# Patient Record
Sex: Female | Born: 1990 | Race: Black or African American | Hispanic: No | Marital: Single | Smoking: Former smoker
Health system: Southern US, Community
[De-identification: ages and names within clinical notes are randomized; demographics above are authoritative.]

## PROBLEM LIST (undated history)

## (undated) ENCOUNTER — Emergency Department (HOSPITAL_COMMUNITY): Admission: EM | Discharge status: Absent without leave | Payer: Self-pay

## (undated) DIAGNOSIS — R7303 Prediabetes: Secondary | ICD-10-CM

## (undated) DIAGNOSIS — I10 Essential (primary) hypertension: Secondary | ICD-10-CM

## (undated) HISTORY — PX: FRACTURE SURGERY: SHX138

---
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
## 2008-06-05 ENCOUNTER — Emergency Department (HOSPITAL_COMMUNITY): Admission: EM | Admit: 2008-06-05 | Discharge: 2008-06-05 | Payer: Self-pay | Admitting: Family Medicine

## 2008-06-05 ENCOUNTER — Inpatient Hospital Stay (HOSPITAL_COMMUNITY): Admission: AD | Admit: 2008-06-05 | Discharge: 2008-06-06 | Payer: Self-pay | Admitting: Obstetrics & Gynecology

## 2008-08-10 ENCOUNTER — Ambulatory Visit (HOSPITAL_COMMUNITY): Admission: RE | Admit: 2008-08-10 | Discharge: 2008-08-10 | Payer: Self-pay | Admitting: Obstetrics & Gynecology

## 2008-10-26 ENCOUNTER — Inpatient Hospital Stay (HOSPITAL_COMMUNITY): Admission: AD | Admit: 2008-10-26 | Discharge: 2008-10-31 | Payer: Self-pay | Admitting: Obstetrics & Gynecology

## 2008-10-27 ENCOUNTER — Encounter: Payer: Self-pay | Admitting: Obstetrics

## 2010-07-30 LAB — CBC
HCT: 29.1 % — ABNORMAL LOW (ref 36.0–46.0)
HCT: 30.5 % — ABNORMAL LOW (ref 36.0–46.0)
HCT: 30.8 % — ABNORMAL LOW (ref 36.0–46.0)
Hemoglobin: 10.1 g/dL — ABNORMAL LOW (ref 12.0–15.0)
Hemoglobin: 10.3 g/dL — ABNORMAL LOW (ref 12.0–15.0)
MCHC: 32.9 g/dL (ref 30.0–36.0)
MCV: 78.3 fL (ref 78.0–100.0)
MCV: 79.1 fL (ref 78.0–100.0)
Platelets: 297 10*3/uL (ref 150–400)
Platelets: 303 10*3/uL (ref 150–400)
RBC: 3.72 MIL/uL — ABNORMAL LOW (ref 3.87–5.11)
RBC: 3.87 MIL/uL (ref 3.87–5.11)
WBC: 16.6 10*3/uL — ABNORMAL HIGH (ref 4.0–10.5)
WBC: 24.3 10*3/uL — ABNORMAL HIGH (ref 4.0–10.5)

## 2010-07-30 LAB — COMPREHENSIVE METABOLIC PANEL
Albumin: 2.8 g/dL — ABNORMAL LOW (ref 3.5–5.2)
Alkaline Phosphatase: 85 U/L (ref 39–117)
BUN: 3 mg/dL — ABNORMAL LOW (ref 6–23)
CO2: 22 mEq/L (ref 19–32)
Chloride: 105 mEq/L (ref 96–112)
Creatinine, Ser: 0.44 mg/dL (ref 0.4–1.2)
GFR calc non Af Amer: >60 mL/min (ref >60)
Glucose, Bld: 93 mg/dL (ref 70–99)
Potassium: 3.5 mEq/L (ref 3.5–5.1)
Total Bilirubin: 0.4 mg/dL (ref 0.3–1.2)

## 2010-07-30 LAB — DIFFERENTIAL
Basophils Absolute: 0 10*3/uL (ref 0.0–0.1)
Basophils Relative: 0 % (ref 0–1)
Lymphocytes Relative: 10 % — ABNORMAL LOW (ref 12–46)
Neutro Abs: 13.8 10*3/uL — ABNORMAL HIGH (ref 1.7–7.7)
Neutrophils Relative %: 83 % — ABNORMAL HIGH (ref 43–77)

## 2010-07-30 LAB — URINE CULTURE
Colony Count: NO GROWTH
Culture: NO GROWTH
Special Requests: NEGATIVE

## 2010-07-30 LAB — URINALYSIS, MICROSCOPIC ONLY
Bilirubin Urine: NEGATIVE
Glucose, UA: NEGATIVE mg/dL
Hgb urine dipstick: NEGATIVE
Protein, ur: NEGATIVE mg/dL
Urobilinogen, UA: 0.2 mg/dL (ref 0.0–1.0)

## 2010-07-30 LAB — RPR: RPR Ser Ql: NONREACTIVE

## 2010-07-30 LAB — STREP B DNA PROBE

## 2010-08-08 LAB — ABO/RH: ABO/RH(D): O POS

## 2010-08-08 LAB — CBC
HCT: 36.9 % (ref 36.0–46.0)
Hemoglobin: 12.1 g/dL (ref 12.0–15.0)
MCHC: 32.7 g/dL (ref 30.0–36.0)
MCV: 81.4 fL (ref 78.0–100.0)
Platelets: 324 10*3/uL (ref 150–400)
RBC: 4.53 MIL/uL (ref 3.87–5.11)
RDW: 14.2 % (ref 11.5–15.5)
WBC: 16.3 10*3/uL — ABNORMAL HIGH (ref 4.0–10.5)

## 2010-08-08 LAB — HCG, QUANTITATIVE, PREGNANCY: hCG, Beta Chain, Quant, S: 74132 m[IU]/mL — ABNORMAL HIGH (ref <5)

## 2010-08-08 LAB — POCT URINALYSIS DIP (DEVICE)
Bilirubin Urine: NEGATIVE
Glucose, UA: NEGATIVE mg/dL
Specific Gravity, Urine: 1.02 (ref 1.005–1.030)
pH: 7 (ref 5.0–8.0)

## 2010-09-08 NOTE — Discharge Summary (Signed)
[name], [name]               ACCOUNT NO.:  [id number]   MEDICAL RECORD NO.:  [id number]          PATIENT TYPE:  INP   LOCATION:  9320                          FACILITY:  [facility]   PHYSICIAN:  Charles A. Clearance Coots, M.D.DATE OF BIRTH:  09-09-1990   DATE OF ADMISSION:  10/26/2008  DATE OF DISCHARGE:  10/31/2008                               DISCHARGE SUMMARY   ADMITTING DIAGNOSES:  30 weeks' gestation, premature rupture of  membranes.   DISCHARGE DIAGNOSES:  30 weeks' gestation, premature rupture of  membranes, status post normal spontaneous vaginal delivery, viable  female on October 29, 2008, at 09:05.  There were no intrapartum  complications and the infant was taken to the Neonatal Intensive Care  Unit for prematurity.  Mother was discharged home in good condition.   REASON FOR ADMISSION:  A 20 year old G1 with estimated date of  confinement of January 04, 2009, presented with a complaint of leaking  clear fluid.  She denied uterine contractions.   PAST MEDICAL HISTORY:   SURGERY:  Right arm.   ILLNESSES:  Seasonal allergies.   MEDICATIONS:  Prenatal vitamins.   ALLERGIES:  SEASONAL.  No known drug allergies   SOCIAL HISTORY:  Consulting civil engineer.  Negative tobacco, alcohol or recreational  drug use.   FAMILY HISTORY:  Lung cancer, cerebrovascular accidents, diabetes and  hypertension.   REVIEW OF SYSTEMS:  Remarkable for genitourinary system with leaking  clear fluid.   PHYSICAL EXAMINATION:  GENERAL:  A well-nourished, well-developed female  in no acute distress.  VITAL SIGNS:  Temperature is 98.4, pulse is 82, respiratory rate 18,  blood pressure 116/61.  LUNGS:  Clear to auscultation bilaterally.  HEART:  Regular rate and rhythm.  ABDOMEN:  Gravid, nontender.  PELVIC:  Sterile speculum exam reveals gross pooling of fluid in the  posterior vaginal vault, Nitrazine positive and fern positive.  Cervix  was 3 cm dilated, 90% effaced, and vertex at a -1 station.   ADMITTING  LABORATORY FINDINGS:  Hemoglobin 10, hematocrit 30, Leggette  blood cell count of 16,600, platelets 297,000.  Comprehensive metabolic  panel was within normal limits.  RPR was nonreactive.  Urinalysis was  within normal limits.  Urine culture was sent.   HOSPITAL COURSE:  The patient was admitted, placed on bedrest, IV  antibiotics and was started on magnesium sulfate IV for cerebral palsy  prophylaxis.  She did well on bed rest.  She was given a standard  regimen of betamethasone for acceleration of fetal lung maturity.  The  patient started to have uterine contractions on hospital day #3 and on  exam it was 5 cm dilated, 100% effaced in a vertex with a 0 station.  She was transferred to labor and delivery and progressed rapidly to  normal spontaneous vaginal delivery of a viable female premature infant.  The infant was taken to the Neonatal Intensive Care Unit.  There were no  intrapartum complications.  The postpartum course was uncomplicated.  The patient was discharged home on postpartum day #2 in good condition.   DISCHARGE LABORATORY FINDINGS:  Hemoglobin of 10, hematocrit 30, Yon  blood  cell count 15,900, platelets 306,000.   DISCHARGE DISPOSITION:  Medications; ibuprofen was prescribed for pain.  Continue prenatal vitamins.  Routine written instructions were given for  discharge after vaginal delivery.  The patient is to call office for a  followup appointment in 6 weeks.      Charles A. Clearance Coots, M.D.  Electronically Signed     CAH/MEDQ  D:  11/10/2008  T:  11/11/2008  Job:  [id number]

## 2010-09-18 ENCOUNTER — Emergency Department (HOSPITAL_COMMUNITY): Payer: BC Managed Care – PPO

## 2010-09-18 ENCOUNTER — Emergency Department (HOSPITAL_COMMUNITY)
Admission: EM | Admit: 2010-09-18 | Discharge: 2010-09-18 | Discharge status: Against Medical Advice | Payer: BC Managed Care – PPO | Attending: Emergency Medicine | Admitting: Emergency Medicine

## 2010-09-18 DIAGNOSIS — M79609 Pain in unspecified limb: Secondary | ICD-10-CM | POA: Insufficient documentation

## 2011-12-18 ENCOUNTER — Encounter (HOSPITAL_COMMUNITY): Payer: Self-pay | Admitting: Emergency Medicine

## 2011-12-18 ENCOUNTER — Emergency Department (HOSPITAL_COMMUNITY)
Admission: EM | Admit: 2011-12-18 | Discharge: 2011-12-18 | Disposition: A | Discharge status: Home / Self Care | Payer: Self-pay | Attending: Emergency Medicine | Admitting: Emergency Medicine

## 2011-12-18 DIAGNOSIS — N730 Acute parametritis and pelvic cellulitis: Secondary | ICD-10-CM

## 2011-12-18 DIAGNOSIS — N739 Female pelvic inflammatory disease, unspecified: Secondary | ICD-10-CM | POA: Insufficient documentation

## 2011-12-18 DIAGNOSIS — F17200 Nicotine dependence, unspecified, uncomplicated: Secondary | ICD-10-CM | POA: Insufficient documentation

## 2011-12-18 DIAGNOSIS — A599 Trichomoniasis, unspecified: Secondary | ICD-10-CM | POA: Insufficient documentation

## 2011-12-18 LAB — WET PREP, GENITAL: Yeast Wet Prep HPF POC: NONE SEEN

## 2011-12-18 LAB — URINALYSIS, ROUTINE W REFLEX MICROSCOPIC
Bilirubin Urine: NEGATIVE
Glucose, UA: NEGATIVE mg/dL
Nitrite: POSITIVE — AB
Specific Gravity, Urine: 1.025 (ref 1.005–1.030)
pH: 5.5 (ref 5.0–8.0)

## 2011-12-18 LAB — CBC
MCH: 26.5 pg (ref 26.0–34.0)
Platelets: 333 10*3/uL (ref 150–400)
RBC: 5.1 MIL/uL (ref 3.87–5.11)

## 2011-12-18 LAB — URINE MICROSCOPIC-ADD ON

## 2011-12-18 MED ORDER — LIDOCAINE HCL 1 % IJ SOLN
INTRAMUSCULAR | Status: AC
  Administered 2011-12-18: 2 mL
  Filled 2011-12-18: qty 20

## 2011-12-18 MED ORDER — SULFAMETHOXAZOLE-TRIMETHOPRIM 800-160 MG PO TABS: 1.0000 | ORAL_TABLET | Freq: Two times a day (BID) | ORAL | Status: AC

## 2011-12-18 MED ORDER — AZITHROMYCIN 1 G PO PACK
1.0000 g | PACK | Freq: Once | ORAL | Status: AC
  Administered 2011-12-18: 1 g via ORAL
  Filled 2011-12-18: qty 1

## 2011-12-18 MED ORDER — METRONIDAZOLE 500 MG PO TABS: 500.0000 mg | ORAL_TABLET | Freq: Two times a day (BID) | ORAL | Status: AC

## 2011-12-18 MED ORDER — CEFTRIAXONE SODIUM 250 MG IJ SOLR
250.0000 mg | Freq: Once | INTRAMUSCULAR | Status: AC
  Administered 2011-12-18: 250 mg via INTRAMUSCULAR
  Filled 2011-12-18: qty 250

## 2011-12-18 NOTE — ED Provider Notes (Signed)
History     CSN: [id number]  Arrival date & time 12/18/11  1612   First MD Initiated Contact with Patient 12/18/11 2043      Chief Complaint  Patient presents with  . Abdominal Pain  . Back Pain    (Consider location/radiation/quality/duration/timing/severity/associated sxs/prior treatment) Patient is a 21 y.o. female presenting with abdominal pain and back pain. The history is provided by the patient.  Abdominal Pain The primary symptoms of the illness include abdominal pain.  Additional symptoms associated with the illness include back pain.  Back Pain  Associated symptoms include abdominal pain.   patient here with lower abdominal pain and Kreiger vaginal discharge. No dysuria or hematuria. No fever. Does admit to unprotected intercourse. Denies any vaginal rashes. No medications taken prior to arrival. Nothing makes her symptoms better or worse  History reviewed. No pertinent past medical history.  History reviewed. No pertinent past surgical history.  No family history on file.  History  Substance Use Topics  . Smoking status: Current Everyday Smoker -- 0.2 packs/day    Types: Cigarettes  . Smokeless tobacco: Never Used  . Alcohol Use: Yes     socially    OB History    Grav Para Term Preterm Abortions TAB SAB Ect Mult Living                  Review of Systems  Gastrointestinal: Positive for abdominal pain.  Musculoskeletal: Positive for back pain.  All other systems reviewed and are negative.    Allergies  Review of patient's allergies indicates no known allergies.  Home Medications  No current outpatient prescriptions on file.  BP 126/87  Pulse 73  Temp 98.1 F (36.7 C) (Oral)  Resp 20  Ht 5\' 8"  (1.727 m)  Wt 213 lb (96.616 kg)  BMI 32.39 kg/m2  SpO2 100%  LMP 11/27/2011  Physical Exam  Nursing note and vitals reviewed. Constitutional: She is oriented to person, place, and time. She appears well-developed and well-nourished.  Non-toxic  appearance. No distress.  HENT:  Head: Normocephalic and atraumatic.  Eyes: Conjunctivae, EOM and lids are normal. Pupils are equal, round, and reactive to light.  Neck: Normal range of motion. Neck supple. No tracheal deviation present. No mass present.  Cardiovascular: Normal rate, regular rhythm and normal heart sounds.  Exam reveals no gallop.   No murmur heard. Pulmonary/Chest: Effort normal and breath sounds normal. No stridor. No respiratory distress. She has no decreased breath sounds. She has no wheezes. She has no rhonchi. She has no rales.  Abdominal: Soft. Normal appearance and bowel sounds are normal. She exhibits no distension. There is no tenderness. There is no rigidity, no rebound, no guarding and no CVA tenderness.  Genitourinary: Vaginal discharge found.  Musculoskeletal: Normal range of motion. She exhibits no edema and no tenderness.  Neurological: She is alert and oriented to person, place, and time. She has normal strength. No cranial nerve deficit or sensory deficit. GCS eye subscore is 4. GCS verbal subscore is 5. GCS motor subscore is 6.  Skin: Skin is warm and dry. No abrasion and no rash noted.  Psychiatric: She has a normal mood and affect. Her speech is normal and behavior is normal.    ED Course  Procedures (including critical care time)  Labs Reviewed  URINALYSIS, ROUTINE W REFLEX MICROSCOPIC - Abnormal; Notable for the following:    APPearance CLOUDY (*)     Hgb urine dipstick TRACE (*)     Nitrite POSITIVE (*)  Leukocytes, UA LARGE (*)     All other components within normal limits  URINE MICROSCOPIC-ADD ON - Abnormal; Notable for the following:    Squamous Epithelial / LPF FEW (*)     Bacteria, UA MANY (*)     All other components within normal limits  POCT PREGNANCY, URINE  WET PREP, GENITAL  GC/CHLAMYDIA PROBE AMP, GENITAL  CBC   No results found.   No diagnosis found.    MDM  Pt to be treated for pid and trich        Toy Baker, MD 12/18/11 2240

## 2011-12-18 NOTE — ED Notes (Signed)
Pt presents with lower abdominal pain; sts she has Requena odorless vaginal discharge; denies any trouble urinating, admits to not using contraception with current sexual partner.  Pt also has lower back pain that she describes as sharp; pain does not radiate.

## 2011-12-19 LAB — GC/CHLAMYDIA PROBE AMP, GENITAL
Chlamydia, DNA Probe: NEGATIVE
GC Probe Amp, Genital: NEGATIVE

## 2013-01-08 ENCOUNTER — Emergency Department (HOSPITAL_COMMUNITY)
Admission: EM | Admit: 2013-01-08 | Discharge: 2013-01-08 | Disposition: A | Discharge status: Home / Self Care | Payer: BC Managed Care – PPO | Attending: Emergency Medicine | Admitting: Emergency Medicine

## 2013-01-08 ENCOUNTER — Encounter (HOSPITAL_COMMUNITY): Payer: Self-pay | Admitting: *Deleted

## 2013-01-08 DIAGNOSIS — H60399 Other infective otitis externa, unspecified ear: Secondary | ICD-10-CM | POA: Insufficient documentation

## 2013-01-08 DIAGNOSIS — F17200 Nicotine dependence, unspecified, uncomplicated: Secondary | ICD-10-CM | POA: Insufficient documentation

## 2013-01-08 DIAGNOSIS — H6093 Unspecified otitis externa, bilateral: Secondary | ICD-10-CM

## 2013-01-08 MED ORDER — ANTIPYRINE-BENZOCAINE 5.4-1.4 % OT SOLN: 3.0000 [drp] | OTIC | Status: DC | PRN

## 2013-01-08 MED ORDER — CIPROFLOXACIN-DEXAMETHASONE 0.3-0.1 % OT SUSP: 4.0000 [drp] | Freq: Two times a day (BID) | OTIC | Status: DC

## 2013-01-08 NOTE — ED Notes (Signed)
Started with right ear pain 4-5 days ago. Left ear started hurting 2 days ago. States has "bad allergies and sinus problems".

## 2013-01-08 NOTE — ED Provider Notes (Signed)
CSN: [id number]     Arrival date & time 01/08/13  1124 History   First MD Initiated Contact with Patient 01/08/13 1327     Chief Complaint  Patient presents with  . Otalgia   (Consider location/radiation/quality/duration/timing/severity/associated sxs/prior Treatment) The history is provided by the patient and medical records.   Presents to the ED for bilateral otalgia x5 days. Patient states she has had some serous drainage from her ears in her hearing sounds "muffled". No trauma to the ears.  Denies any bleeding from the ears. She denies any swimming activities or head submersion. No recent fevers, sweats, or chills.  History reviewed. No pertinent past medical history. History reviewed. No pertinent past surgical history. History reviewed. No pertinent family history. History  Substance Use Topics  . Smoking status: Current Every Day Smoker -- 0.25 packs/day    Types: Cigarettes  . Smokeless tobacco: Never Used  . Alcohol Use: Yes     Comment: socially   OB History   Grav Para Term Preterm Abortions TAB SAB Ect Mult Living                 Review of Systems  HENT: Positive for ear pain and ear discharge.   All other systems reviewed and are negative.    Allergies  Review of patient's allergies indicates no known allergies.  Home Medications  No current outpatient prescriptions on file. BP 124/68  Pulse 85  Temp(Src) 98.2 F (36.8 C) (Oral)  Resp 18  SpO2 98%  LMP 12/27/2012  Physical Exam  Nursing note and vitals reviewed. Constitutional: She is oriented to person, place, and time. She appears well-developed and well-nourished. No distress.  HENT:  Head: Normocephalic and atraumatic.  Right Ear: Tympanic membrane normal. There is drainage.  Left Ear: Tympanic membrane normal. There is drainage.  EAC's non-erythematous but swollen bilaterally with serous drainage present; pain with movement of external ear bilaterally; TM's normal in appearance  Eyes:  Conjunctivae and EOM are normal. Pupils are equal, round, and reactive to light.  Neck: Normal range of motion. Neck supple.  Cardiovascular: Normal rate, regular rhythm and normal heart sounds.   Pulmonary/Chest: Effort normal and breath sounds normal.  Musculoskeletal: Normal range of motion.  Lymphadenopathy:    She has no cervical adenopathy.  Neurological: She is alert and oriented to person, place, and time.  Skin: Skin is warm. She is not diaphoretic.  Psychiatric: She has a normal mood and affect.    ED Course  Procedures (including critical care time) Labs Review Labs Reviewed - No data to display Imaging Review No results found.  MDM   1. Otitis externa of both ears    Bilateral otitis externa without signs of OM. Patient will be started on Ciprodex and Auralgon.  She will FU with [facility] if no improvement in the next few days.  Discussed plan with pt and mom, they agreed.  Return precautions advised.  Garlon Hatchet, PA-C 01/08/13 1402

## 2013-01-08 NOTE — ED Provider Notes (Signed)
Medical screening examination/treatment/procedure(s) were performed by non-physician practitioner and as supervising physician I was immediately available for consultation/collaboration.  Addalyne Vandehei F Arzella Rehmann, MD 01/08/13 1832 

## 2013-01-08 NOTE — ED Notes (Signed)
Reports bilateral ear pain since Saturday, no acute distress noted at triage.

## 2013-11-07 ENCOUNTER — Emergency Department (HOSPITAL_COMMUNITY)
Admission: EM | Admit: 2013-11-07 | Discharge: 2013-11-08 | Disposition: A | Discharge status: Home / Self Care | Payer: BC Managed Care – PPO | Attending: Emergency Medicine | Admitting: Emergency Medicine

## 2013-11-07 ENCOUNTER — Encounter (HOSPITAL_COMMUNITY): Payer: Self-pay | Admitting: Emergency Medicine

## 2013-11-07 DIAGNOSIS — L02419 Cutaneous abscess of limb, unspecified: Secondary | ICD-10-CM | POA: Insufficient documentation

## 2013-11-07 DIAGNOSIS — L03115 Cellulitis of right lower limb: Secondary | ICD-10-CM

## 2013-11-07 DIAGNOSIS — F17200 Nicotine dependence, unspecified, uncomplicated: Secondary | ICD-10-CM | POA: Insufficient documentation

## 2013-11-07 DIAGNOSIS — L03119 Cellulitis of unspecified part of limb: Principal | ICD-10-CM

## 2013-11-07 LAB — I-STAT CHEM 8, ED
BUN: 6 mg/dL (ref 6–23)
CALCIUM ION: 1.19 mmol/L (ref 1.12–1.23)
CREATININE: 0.7 mg/dL (ref 0.50–1.10)
Chloride: 100 mEq/L (ref 96–112)
GLUCOSE: 98 mg/dL (ref 70–99)
HEMATOCRIT: 41 % (ref 36.0–46.0)
HEMOGLOBIN: 13.9 g/dL (ref 12.0–15.0)
POTASSIUM: 3.4 meq/L — AB (ref 3.7–5.3)
Sodium: 139 mEq/L (ref 137–147)
TCO2: 24 mmol/L (ref 0–100)

## 2013-11-07 LAB — CBC WITH DIFFERENTIAL/PLATELET
BASOS PCT: 0 % (ref 0–1)
Basophils Absolute: 0 10*3/uL (ref 0.0–0.1)
EOS ABS: 0.3 10*3/uL (ref 0.0–0.7)
EOS PCT: 2 % (ref 0–5)
HCT: 37.7 % (ref 36.0–46.0)
HEMOGLOBIN: 11.9 g/dL — AB (ref 12.0–15.0)
LYMPHS ABS: 3.1 10*3/uL (ref 0.7–4.0)
Lymphocytes Relative: 20 % (ref 12–46)
MCH: 23.8 pg — AB (ref 26.0–34.0)
MCHC: 31.6 g/dL (ref 30.0–36.0)
MCV: 75.6 fL — AB (ref 78.0–100.0)
MONOS PCT: 5 % (ref 3–12)
Monocytes Absolute: 0.8 10*3/uL (ref 0.1–1.0)
NEUTROS PCT: 73 % (ref 43–77)
Neutro Abs: 11.2 10*3/uL — ABNORMAL HIGH (ref 1.7–7.7)
Platelets: 404 10*3/uL — ABNORMAL HIGH (ref 150–400)
RBC: 4.99 MIL/uL (ref 3.87–5.11)
RDW: 15.6 % — ABNORMAL HIGH (ref 11.5–15.5)
WBC: 15.4 10*3/uL — ABNORMAL HIGH (ref 4.0–10.5)

## 2013-11-07 MED ORDER — CLINDAMYCIN HCL 150 MG PO CAPS: 150.0000 mg | ORAL_CAPSULE | Freq: Four times a day (QID) | ORAL | Status: DC

## 2013-11-07 MED ORDER — HYDROXYZINE HCL 25 MG PO TABS: 25.0000 mg | ORAL_TABLET | Freq: Four times a day (QID) | ORAL | Status: DC

## 2013-11-07 MED ORDER — HYDROXYZINE HCL 10 MG PO TABS
10.0000 mg | ORAL_TABLET | Freq: Once | ORAL | Status: AC
  Administered 2013-11-07: 10 mg via ORAL
  Filled 2013-11-07: qty 1

## 2013-11-07 MED ORDER — SODIUM CHLORIDE 0.9 % IV BOLUS (SEPSIS)
1000.0000 mL | Freq: Once | INTRAVENOUS | Status: AC
Start: 2013-11-07 — End: 2013-11-08
  Administered 2013-11-07: 1000 mL via INTRAVENOUS

## 2013-11-07 MED ORDER — CLINDAMYCIN PHOSPHATE 600 MG/50ML IV SOLN
600.0000 mg | Freq: Once | INTRAVENOUS | Status: AC
  Administered 2013-11-07: 600 mg via INTRAVENOUS
  Filled 2013-11-07: qty 50

## 2013-11-07 NOTE — Discharge Instructions (Signed)
Cellulitis Cellulitis is an infection of the skin and the tissue beneath it. The infected area is usually red and tender. Cellulitis occurs most often in the arms and lower legs.  CAUSES  Cellulitis is caused by bacteria that enter the skin through cracks or cuts in the skin. The most common types of bacteria that cause cellulitis are Staphylococcus and Streptococcus. SYMPTOMS   Redness and warmth.  Swelling.  Tenderness or pain.  Fever. DIAGNOSIS  Your caregiver can usually determine what is wrong based on a physical exam. Blood tests may also be done. TREATMENT  Treatment usually involves taking an antibiotic medicine. HOME CARE INSTRUCTIONS   Take your antibiotics as directed. Finish them even if you start to feel better.  Keep the infected arm or leg elevated to reduce swelling.  Apply a warm cloth to the affected area up to 4 times per day to relieve pain.  Only take over-the-counter or prescription medicines for pain, discomfort, or fever as directed by your caregiver.  Keep all follow-up appointments as directed by your caregiver. SEEK MEDICAL CARE IF:   You notice red streaks coming from the infected area.  Your red area gets larger or turns dark in color.  Your bone or joint underneath the infected area becomes painful after the skin has healed.  Your infection returns in the same area or another area.  You notice a swollen bump in the infected area.  You develop new symptoms. SEEK IMMEDIATE MEDICAL CARE IF:   You have a fever.  You feel very sleepy.  You develop vomiting or diarrhea.  You have a general ill feeling (malaise) with muscle aches and pains. MAKE SURE YOU:   Understand these instructions.  Will watch your condition.  Will get help right away if you are not doing well or get worse. Document Released: 01/17/2005 Document Revised: 10/09/2011 Document Reviewed: 06/25/2011 Exit[employer] Patient Information 2015 [employer]. This information is  not intended to replace advice given to you by your health care provider. Make sure you discuss any questions you have with your health care provider.  

## 2013-11-07 NOTE — ED Provider Notes (Signed)
CSN: [id number]     Arrival date & time 11/07/13  2216 History  This chart was scribed for non-physician practitioner, Marlon Pel, PA-C working with Lyanne Co, MD by Greggory Stallion, ED scribe. This patient was seen in room WTR7/WTR7 and the patient's care was started at 10:43 PM.   Chief Complaint  Patient presents with  . Abscess   The history is provided by the patient. No language interpreter was used.   HPI Comments: [name] is a 23 y.o. female who presents to the Emergency Department complaining of a worsening abscess to her left lower leg that started yesterday. Pain, redness and swelling have increased since she noticed it. She thinks she might have gotten bit by something. Pt noticed some pus drainage from the area last night. She has put peroxide over the area with no relief. Denies fever, nausea, emesis, diarrhea.   History reviewed. No pertinent past medical history. History reviewed. No pertinent past surgical history. No family history on file. History  Substance Use Topics  . Smoking status: Current Every Day Smoker -- 0.25 packs/day    Types: Cigarettes  . Smokeless tobacco: Never Used  . Alcohol Use: Yes     Comment: socially   OB History   Grav Para Term Preterm Abortions TAB SAB Ect Mult Living                 Review of Systems  Constitutional: Negative for fever.  Gastrointestinal: Negative for nausea, vomiting and diarrhea.  All other systems reviewed and are negative.  Allergies  Review of patient's allergies indicates no known allergies.  Home Medications   Prior to Admission medications   Medication Sig Start Date End Date Taking? Authorizing Provider  clindamycin (CLEOCIN) 150 MG capsule Take 1 capsule (150 mg total) by mouth every 6 (six) hours. 11/07/13   Shanekia Latella Irine Seal, PA-C  hydrOXYzine (ATARAX/VISTARIL) 25 MG tablet Take 1 tablet (25 mg total) by mouth every 6 (six) hours. 11/07/13   Kenzie Flakes Irine Seal, PA-C   BP 127/85  Pulse 93   Temp(Src) 99.1 F (37.3 C) (Oral)  Resp 17  SpO2 100%  Physical Exam  Nursing note and vitals reviewed. Constitutional: She is oriented to person, place, and time. She appears well-developed and well-nourished. No distress.  HENT:  Head: Normocephalic and atraumatic.  Eyes: Conjunctivae and EOM are normal.  Neck: Neck supple. No tracheal deviation present.  Cardiovascular: Normal rate.   Pulmonary/Chest: Effort normal. No respiratory distress.  Musculoskeletal: Normal range of motion.  Neurological: She is alert and oriented to person, place, and time.  Skin: Skin is warm and dry.  Diffuse distal lower extremity swelling with circumferential edema, firmness and induration. It spares the foot and the pt's knee on the left leg. Two small blisters noted with clear drainage coming from it.  Psychiatric: She has a normal mood and affect. Her behavior is normal.    ED Course  Procedures (including critical care time)  DIAGNOSTIC STUDIES: Oxygen Saturation is 100% on RA, normal by my interpretation.    COORDINATION OF CARE: 10:46 PM-Discussed treatment plan which includes blood work and IV antibiotics with pt at bedside and pt agreed to plan.   Labs Review Labs Reviewed  CBC WITH DIFFERENTIAL - Abnormal; Notable for the following:    WBC 15.4 (*)    Hemoglobin 11.9 (*)    MCV 75.6 (*)    MCH 23.8 (*)    RDW 15.6 (*)    Platelets  404 (*)    Neutro Abs 11.2 (*)    All other components within normal limits  I-STAT CHEM 8, ED - Abnormal; Notable for the following:    Potassium 3.4 (*)    All other components within normal limits    Imaging Review No results found.   EKG Interpretation None      MDM   Final diagnoses:  Cellulitis of right lower extremity    Baseline blood work drawn, first dose of clindamycin given in ED. Dx: Clindamycin and Vistaril  22 y.o.Vianca L [place]'s evaluation in the Emergency Department is complete. It has been determined that no acute  conditions requiring further emergency intervention are present at this time. The patient/guardian have been advised of the diagnosis and plan. We have discussed signs and symptoms that warrant return to the ED, such as changes or worsening in symptoms.  Vital signs are stable at discharge. Filed Vitals:   11/07/13 2230  BP: 127/85  Pulse: 93  Temp: 99.1 F (37.3 C)  Resp: 17    Patient/guardian has voiced understanding and agreed to follow-up with the PCP or specialist.    I personally performed the services described in this documentation, which was scribed in my presence. The recorded information has been reviewed and is accurate.  Dorthula Matasiffany G Carneshia Raker, PA-C 11/07/13 2357

## 2013-11-07 NOTE — ED Notes (Signed)
Pt states she has a sore to her L lower leg. Pt states she just noticed it yesterday and today it is getting worse. Pt states she has increased redness, swelling and pus drainage from wound. Area is itchy and warm to touch.

## 2013-11-07 NOTE — ED Provider Notes (Signed)
Medical screening examination/treatment/procedure(s) were performed by non-physician practitioner and as supervising physician I was immediately available for consultation/collaboration.   EKG Interpretation None        Shelbi Vaccaro M Gaelle Adriance, MD 11/07/13 2357 

## 2013-11-08 NOTE — ED Notes (Signed)
Patient is alert and oriented x3.  She was given DC instructions and follow up visit instructions.  Patient gave verbal understanding. She was DC ambulatory under her own power to home.  V/S stable.  He was not showing any signs of distress on DC 

## 2013-11-17 ENCOUNTER — Emergency Department (HOSPITAL_COMMUNITY)
Admission: EM | Admit: 2013-11-17 | Discharge: 2013-11-17 | Disposition: A | Discharge status: Home / Self Care | Payer: BC Managed Care – PPO | Attending: Emergency Medicine | Admitting: Emergency Medicine

## 2013-11-17 ENCOUNTER — Encounter (HOSPITAL_COMMUNITY): Payer: Self-pay | Admitting: Emergency Medicine

## 2013-11-17 DIAGNOSIS — T7840XA Allergy, unspecified, initial encounter: Secondary | ICD-10-CM

## 2013-11-17 DIAGNOSIS — F17200 Nicotine dependence, unspecified, uncomplicated: Secondary | ICD-10-CM | POA: Insufficient documentation

## 2013-11-17 DIAGNOSIS — T4995XA Adverse effect of unspecified topical agent, initial encounter: Secondary | ICD-10-CM | POA: Insufficient documentation

## 2013-11-17 DIAGNOSIS — R21 Rash and other nonspecific skin eruption: Secondary | ICD-10-CM | POA: Insufficient documentation

## 2013-11-17 LAB — CBG MONITORING, ED: Glucose-Capillary: 90 mg/dL (ref 70–99)

## 2013-11-17 MED ORDER — DIPHENHYDRAMINE HCL 25 MG PO TABS: 25.0000 mg | ORAL_TABLET | Freq: Four times a day (QID) | ORAL | Status: DC

## 2013-11-17 MED ORDER — DIPHENHYDRAMINE HCL 25 MG PO CAPS
25.0000 mg | ORAL_CAPSULE | Freq: Once | ORAL | Status: AC
  Administered 2013-11-17: 25 mg via ORAL
  Filled 2013-11-17: qty 1

## 2013-11-17 MED ORDER — FAMOTIDINE 20 MG PO TABS
20.0000 mg | ORAL_TABLET | Freq: Once | ORAL | Status: AC
  Administered 2013-11-17: 20 mg via ORAL
  Filled 2013-11-17: qty 1

## 2013-11-17 MED ORDER — PREDNISONE 20 MG PO TABS
60.0000 mg | ORAL_TABLET | Freq: Once | ORAL | Status: AC
  Administered 2013-11-17: 60 mg via ORAL
  Filled 2013-11-17: qty 3

## 2013-11-17 MED ORDER — FAMOTIDINE 20 MG PO TABS: 20.0000 mg | ORAL_TABLET | Freq: Two times a day (BID) | ORAL | Status: DC

## 2013-11-17 MED ORDER — PREDNISONE 10 MG PO TABS: 20.0000 mg | ORAL_TABLET | Freq: Every day | ORAL | Status: DC

## 2013-11-17 NOTE — ED Notes (Signed)
See triage note.

## 2013-11-17 NOTE — ED Provider Notes (Signed)
CSN: [id number]     Arrival date & time 11/17/13  1816 History  This chart was scribed for non-physician provider Marlon Peliffany Quincy Prisco, PA-C, working with Glynn OctaveStephen Rancour, MD by Phillis HaggisGabriella Gaje, ED Scribe. This patient was seen in room WTR9/WTR9 and patient care was started at 6:56 PM.     Chief Complaint  Patient presents with  . Allergic Reaction   The history is provided by the patient. No language interpreter was used.   HPI Comments: Sherral Hammersndreka L Couts is a 23 y.o. female who presents to the Emergency Department complaining of rash to bilateral arms, neck and left leg. She reports she was given a dose of Clindamycin for infection in the ED 10 days ago and a prescription for home. She did not get her prescription filled because she felt like the one dose was sufficient. A few days ago she developed an itchy elevated rash that does not hurt. She has not had any fevers, weakness, nausea, vomiting, diarrhea, abdominal pain.   History reviewed. No pertinent past medical history. History reviewed. No pertinent past surgical history. No family history on file. History  Substance Use Topics  . Smoking status: Current Every Day Smoker -- 0.25 packs/day    Types: Cigarettes  . Smokeless tobacco: Never Used  . Alcohol Use: Yes     Comment: socially   OB History   Grav Para Term Preterm Abortions TAB SAB Ect Mult Living                 Review of Systems  Skin: Positive for rash.   Allergies  Review of patient's allergies indicates no known allergies.  Home Medications   Prior to Admission medications   Medication Sig Start Date End Date Taking? Authorizing Provider  clindamycin (CLEOCIN) 150 MG capsule Take 1 capsule (150 mg total) by mouth every 6 (six) hours. 11/07/13   Lizann Edelman Irine SealG Naziyah Tieszen, PA-C  diphenhydrAMINE (BENADRYL) 25 MG tablet Take 1 tablet (25 mg total) by mouth every 6 (six) hours. 11/17/13   Tarez Bowns Irine SealG Eduard Penkala, PA-C  famotidine (PEPCID) 20 MG tablet Take 1 tablet (20 mg total) by mouth  2 (two) times daily. 11/17/13   Allesandra Huebsch Irine SealG Aylin Rhoads, PA-C  hydrOXYzine (ATARAX/VISTARIL) 25 MG tablet Take 1 tablet (25 mg total) by mouth every 6 (six) hours. 11/07/13   Deysha Cartier Irine SealG Kingstyn Deruiter, PA-C  predniSONE (DELTASONE) 10 MG tablet Take 2 tablets (20 mg total) by mouth daily. 11/17/13   Tameko Halder Irine SealG Algernon Mundie, PA-C   BP 136/86  Pulse 93  Temp(Src) 98.9 F (37.2 C) (Oral)  Resp 20  SpO2 99%  LMP 10/27/2013 Physical Exam  Nursing note and vitals reviewed. Constitutional: She is oriented to person, place, and time. She appears well-developed and well-nourished.  HENT:  Head: Normocephalic and atraumatic.  Eyes: EOM are normal.  Neck: Normal range of motion. Neck supple.  Cardiovascular: Normal rate.   Pulmonary/Chest: Effort normal.  Musculoskeletal: Normal range of motion.  Neurological: She is alert and oriented to person, place, and time.  Skin: Skin is warm and dry. Rash noted. Rash is urticarial.  Psychiatric: She has a normal mood and affect. Her behavior is normal.    ED Course  Procedures (including critical care time) DIAGNOSTIC STUDIES: Oxygen Saturation is 99% on room air, normal by my interpretation.    COORDINATION OF CARE:  Labs Review Labs Reviewed  CBG MONITORING, ED    Imaging Review No results found.   EKG Interpretation None      MDM  Final diagnoses:  Allergic reaction, initial encounter    The patients symptoms are consistent with allergic urticaria. CBG checked and then a dose of 60 mg Prednisone, pepcid and benadryl  In the ED. She did not have any throat swelling, wheezing or systemic symptoms.   diphenhydrAMINE (BENADRYL) 25 MG tablet Take 1 tablet (25 mg total) by mouth every 6 (six) hours. 20 tablet Dorthula Matas, PA-C famotidine (PEPCID) 20 MG tablet Take 1 tablet (20 mg total) by mouth 2 (two) times daily. 30 tablet Dorthula Matas, PA-C hydrOXYzine (ATARAX/VISTARIL) 25 MG tablet Take 1 tablet (25 mg total) by mouth every 6 (six) hours. 12  tablet Dorthula Matas, PA-C predniSONE (DELTASONE) 10 MG tablet Take 2 tablets (20 mg total) by mouth daily. 21 tablet Dorthula Matas, PA-C   22 y.o.Almyra L [place]'s evaluation in the Emergency Department is complete. It has been determined that no acute conditions requiring further emergency intervention are present at this time. The patient/guardian have been advised of the diagnosis and plan. We have discussed signs and symptoms that warrant return to the ED, such as changes or worsening in symptoms.  Vital signs are stable at discharge. Filed Vitals:   11/17/13 1855  BP: 136/86  Pulse: 93  Temp: 98.9 F (37.2 C)  Resp: 20    Patient/guardian has voiced understanding and agreed to follow-up with the PCP or specialist.     I personally performed the services described in this documentation, which was scribed in my presence. The recorded information has been reviewed and is accurate.     Dorthula Matas, PA-C 11/17/13 2319

## 2013-11-17 NOTE — ED Notes (Signed)
Pt reports generalized itching, large hives noted all over her body.  Pt denies new detergent or soap or taking new meds at this time.

## 2013-11-17 NOTE — Discharge Instructions (Signed)
Hives Hives are itchy, red, swollen areas of the skin. They can vary in size and location on your body. Hives can come and go for hours or several days (acute hives) or for several weeks (chronic hives). Hives do not spread from person to person (noncontagious). They may get worse with scratching, exercise, and emotional stress. CAUSES   Allergic reaction to food, additives, or drugs.  Infections, including the common cold.  Illness, such as vasculitis, lupus, or thyroid disease.  Exposure to sunlight, heat, or cold.  Exercise.  Stress.  Contact with chemicals. SYMPTOMS   Red or Prosise swollen patches on the skin. The patches may change size, shape, and location quickly and repeatedly.  Itching.  Swelling of the hands, feet, and face. This may occur if hives develop deeper in the skin. DIAGNOSIS  Your caregiver can usually tell what is wrong by performing a physical exam. Skin or blood tests may also be done to determine the cause of your hives. In some cases, the cause cannot be determined. TREATMENT  Mild cases usually get better with medicines such as antihistamines. Severe cases may require an emergency epinephrine injection. If the cause of your hives is known, treatment includes avoiding that trigger.  HOME CARE INSTRUCTIONS   Avoid causes that trigger your hives.  Take antihistamines as directed by your caregiver to reduce the severity of your hives. Non-sedating or low-sedating antihistamines are usually recommended. Do not drive while taking an antihistamine.  Take any other medicines prescribed for itching as directed by your caregiver.  Wear loose-fitting clothing.  Keep all follow-up appointments as directed by your caregiver. SEEK MEDICAL CARE IF:   You have persistent or severe itching that is not relieved with medicine.  You have painful or swollen joints. SEEK IMMEDIATE MEDICAL CARE IF:   You have a fever.  Your tongue or lips are swollen.  You have  trouble breathing or swallowing.  You feel tightness in the throat or chest.  You have abdominal pain. These problems may be the first sign of a life-threatening allergic reaction. Call your local emergency services (911 in [place] MAKE SURE YOU:   Understand these instructions.  Will watch your condition.  Will get help right away if you are not doing well or get worse. Document Released: 04/09/2005 Document Revised: 04/14/2013 Document Reviewed: 07/03/2011 Exit[employer] Patient Information 2015 [employer]. This information is not intended to replace advice given to you by your health care provider. Make sure you discuss any questions you have with your health care provider.  

## 2013-11-18 NOTE — ED Provider Notes (Signed)
Medical screening examination/treatment/procedure(s) were performed by non-physician practitioner and as supervising physician I was immediately available for consultation/collaboration.   EKG Interpretation None        Liset Mcmonigle, MD 11/18/13 0005 

## 2014-06-11 ENCOUNTER — Ambulatory Visit: Payer: Self-pay

## 2014-08-19 ENCOUNTER — Encounter (HOSPITAL_BASED_OUTPATIENT_CLINIC_OR_DEPARTMENT_OTHER): Payer: Self-pay

## 2014-08-19 ENCOUNTER — Emergency Department (HOSPITAL_BASED_OUTPATIENT_CLINIC_OR_DEPARTMENT_OTHER)
Admission: EM | Admit: 2014-08-19 | Discharge: 2014-08-19 | Disposition: A | Discharge status: Home / Self Care | Payer: Self-pay | Attending: Emergency Medicine | Admitting: Emergency Medicine

## 2014-08-19 DIAGNOSIS — A499 Bacterial infection, unspecified: Secondary | ICD-10-CM

## 2014-08-19 DIAGNOSIS — N390 Urinary tract infection, site not specified: Secondary | ICD-10-CM | POA: Insufficient documentation

## 2014-08-19 DIAGNOSIS — Z3202 Encounter for pregnancy test, result negative: Secondary | ICD-10-CM | POA: Insufficient documentation

## 2014-08-19 DIAGNOSIS — Z79899 Other long term (current) drug therapy: Secondary | ICD-10-CM | POA: Insufficient documentation

## 2014-08-19 DIAGNOSIS — Z7952 Long term (current) use of systemic steroids: Secondary | ICD-10-CM | POA: Insufficient documentation

## 2014-08-19 DIAGNOSIS — Z720 Tobacco use: Secondary | ICD-10-CM | POA: Insufficient documentation

## 2014-08-19 DIAGNOSIS — N898 Other specified noninflammatory disorders of vagina: Secondary | ICD-10-CM | POA: Insufficient documentation

## 2014-08-19 DIAGNOSIS — B9689 Other specified bacterial agents as the cause of diseases classified elsewhere: Secondary | ICD-10-CM | POA: Insufficient documentation

## 2014-08-19 LAB — URINALYSIS, ROUTINE W REFLEX MICROSCOPIC
BILIRUBIN URINE: NEGATIVE
GLUCOSE, UA: NEGATIVE mg/dL
HGB URINE DIPSTICK: NEGATIVE
KETONES UR: NEGATIVE mg/dL
Nitrite: NEGATIVE
PROTEIN: NEGATIVE mg/dL
Specific Gravity, Urine: 1.023 (ref 1.005–1.030)
Urobilinogen, UA: 1 mg/dL (ref 0.0–1.0)
pH: 6 (ref 5.0–8.0)

## 2014-08-19 LAB — URINE MICROSCOPIC-ADD ON

## 2014-08-19 LAB — PREGNANCY, URINE: PREG TEST UR: NEGATIVE

## 2014-08-19 LAB — WET PREP, GENITAL
TRICH WET PREP: NONE SEEN
Yeast Wet Prep HPF POC: NONE SEEN

## 2014-08-19 MED ORDER — CEFTRIAXONE SODIUM 250 MG IJ SOLR
250.0000 mg | Freq: Once | INTRAMUSCULAR | Status: AC
  Administered 2014-08-19: 250 mg via INTRAMUSCULAR
  Filled 2014-08-19: qty 250

## 2014-08-19 MED ORDER — AZITHROMYCIN 250 MG PO TABS
1000.0000 mg | ORAL_TABLET | Freq: Once | ORAL | Status: AC
  Administered 2014-08-19: 1000 mg via ORAL
  Filled 2014-08-19: qty 4

## 2014-08-19 MED ORDER — LIDOCAINE HCL (PF) 1 % IJ SOLN
INTRAMUSCULAR | Status: AC
  Administered 2014-08-19: 1.2 mL
  Filled 2014-08-19: qty 5

## 2014-08-19 MED ORDER — LIDOCAINE HCL (PF) 1 % IJ SOLN
1.2000 mL | Freq: Once | INTRAMUSCULAR | Status: AC
  Administered 2014-08-19: 1.2 mL

## 2014-08-19 MED ORDER — SULFAMETHOXAZOLE-TRIMETHOPRIM 800-160 MG PO TABS: 1.0000 | ORAL_TABLET | Freq: Two times a day (BID) | ORAL | Status: AC

## 2014-08-19 NOTE — ED Provider Notes (Signed)
CSN: [id number]     Arrival date & time 08/19/14  3244 History   First MD Initiated Contact with Patient 08/19/14 1007     Chief Complaint  Patient presents with  . Abdominal Pain     (Consider location/radiation/quality/duration/timing/severity/associated sxs/prior Treatment) HPI Comments: Patient is a 24 year old female who presents with a one-week history of lower abdominal cramping that occurs intermittently. This is related with ibuprofen. She is also wearing a yellowish discharge for the past 3 days. She is sexually active, however with no new partners. Her last menstrual period was 2 weeks ago and normal. She strongly doubts the possibility of pregnancy.  Patient is a 24 y.o. female presenting with abdominal pain. The history is provided by the patient.  Abdominal Pain Pain location:  Suprapubic Pain quality: cramping   Pain radiates to:  Does not radiate Pain severity:  Moderate Onset quality:  Sudden Duration:  1 week Timing:  Intermittent Progression:  Worsening Chronicity:  New Relieved by:  Nothing Worsened by:  Nothing tried Associated symptoms: dysuria and vaginal discharge   Associated symptoms: no constipation, no fever, no nausea, no vaginal bleeding and no vomiting     History reviewed. No pertinent past medical history. History reviewed. No pertinent past surgical history. No family history on file. History  Substance Use Topics  . Smoking status: Current Every Day Smoker -- 0.25 packs/day    Types: Cigarettes  . Smokeless tobacco: Never Used  . Alcohol Use: Yes     Comment: socially   OB History    No data available     Review of Systems  Constitutional: Negative for fever.  Gastrointestinal: Positive for abdominal pain. Negative for nausea, vomiting and constipation.  Genitourinary: Positive for dysuria and vaginal discharge. Negative for vaginal bleeding.  All other systems reviewed and are negative.     Allergies  Review of patient's  allergies indicates no known allergies.  Home Medications   Prior to Admission medications   Medication Sig Start Date End Date Taking? Authorizing Provider  clindamycin (CLEOCIN) 150 MG capsule Take 1 capsule (150 mg total) by mouth every 6 (six) hours. 11/07/13   Tiffany Neva Seat, PA-C  diphenhydrAMINE (BENADRYL) 25 MG tablet Take 1 tablet (25 mg total) by mouth every 6 (six) hours. 11/17/13   Tiffany Neva Seat, PA-C  famotidine (PEPCID) 20 MG tablet Take 1 tablet (20 mg total) by mouth 2 (two) times daily. 11/17/13   Marlon Pel, PA-C  hydrOXYzine (ATARAX/VISTARIL) 25 MG tablet Take 1 tablet (25 mg total) by mouth every 6 (six) hours. 11/07/13   Tiffany Neva Seat, PA-C  predniSONE (DELTASONE) 10 MG tablet Take 2 tablets (20 mg total) by mouth daily. 11/17/13   Tiffany Neva Seat, PA-C   BP 147/88 mmHg  Pulse 96  Temp(Src) 98.1 F (36.7 C) (Oral)  Resp 18  Ht  (1.778 m)  Wt 260 lb (117.935 kg)  BMI 37.31 kg/m2  SpO2 98% Physical Exam  Constitutional: She is oriented to person, place, and time. She appears well-developed and well-nourished. No distress.  HENT:  Head: Normocephalic and atraumatic.  Neck: Normal range of motion. Neck supple.  Cardiovascular: Normal rate and regular rhythm.  Exam reveals no gallop and no friction rub.   No murmur heard. Pulmonary/Chest: Effort normal and breath sounds normal. No respiratory distress. She has no wheezes.  Abdominal: Soft. Bowel sounds are normal. She exhibits no distension. There is no tenderness.  Musculoskeletal: Normal range of motion.  Neurological: She is alert and oriented to  person, place, and time.  Skin: Skin is warm and dry. She is not diaphoretic.  Nursing note and vitals reviewed.   ED Course  Procedures (including critical care time) Labs Review Labs Reviewed  PREGNANCY, URINE  URINALYSIS, ROUTINE W REFLEX MICROSCOPIC    Imaging Review No results found.   EKG Interpretation None      MDM   Final diagnoses:   None    Wet prep reveals too numerous to count [place] cells. She will receive Rocephin and Zithromax to treat empirically for GC and Chlamydia. Her urines also suggestive of a UTI. She will be given Bactrim. Return as needed for any problems.    Geoffery Lyonsouglas Tilda Samudio, MD 08/19/14 1037

## 2014-08-19 NOTE — Discharge Instructions (Signed)
Bactrim as prescribed.  We will call you if your cultures indicate you require further treatment.   Urinary Tract Infection Urinary tract infections (UTIs) can develop anywhere along your urinary tract. Your urinary tract is your body's drainage system for removing wastes and extra water. Your urinary tract includes two kidneys, two ureters, a bladder, and a urethra. Your kidneys are a pair of bean-shaped organs. Each kidney is about the size of your fist. They are located below your ribs, one on each side of your spine. CAUSES Infections are caused by microbes, which are microscopic organisms, including fungi, viruses, and bacteria. These organisms are so small that they can only be seen through a microscope. Bacteria are the microbes that most commonly cause UTIs. SYMPTOMS  Symptoms of UTIs may vary by age and gender of the patient and by the location of the infection. Symptoms in young women typically include a frequent and intense urge to urinate and a painful, burning feeling in the bladder or urethra during urination. Older women and men are more likely to be tired, shaky, and weak and have muscle aches and abdominal pain. A fever may mean the infection is in your kidneys. Other symptoms of a kidney infection include pain in your back or sides below the ribs, nausea, and vomiting. DIAGNOSIS To diagnose a UTI, your caregiver will ask you about your symptoms. Your caregiver also will ask to provide a urine sample. The urine sample will be tested for bacteria and [place] blood cells. Tyree blood cells are made by your body to help fight infection. TREATMENT  Typically, UTIs can be treated with medication. Because most UTIs are caused by a bacterial infection, they usually can be treated with the use of antibiotics. The choice of antibiotic and length of treatment depend on your symptoms and the type of bacteria causing your infection. HOME CARE INSTRUCTIONS  If you were prescribed antibiotics, take  them exactly as your caregiver instructs you. Finish the medication even if you feel better after you have only taken some of the medication.  Drink enough water and fluids to keep your urine clear or pale yellow.  Avoid caffeine, tea, and carbonated beverages. They tend to irritate your bladder.  Empty your bladder often. Avoid holding urine for long periods of time.  Empty your bladder before and after sexual intercourse.  After a bowel movement, women should cleanse from front to back. Use each tissue only once. SEEK MEDICAL CARE IF:   You have back pain.  You develop a fever.  Your symptoms do not begin to resolve within 3 days. SEEK IMMEDIATE MEDICAL CARE IF:   You have severe back pain or lower abdominal pain.  You develop chills.  You have nausea or vomiting.  You have continued burning or discomfort with urination. MAKE SURE YOU:   Understand these instructions.  Will watch your condition.  Will get help right away if you are not doing well or get worse. Document Released: 01/17/2005 Document Revised: 10/09/2011 Document Reviewed: 05/18/2011 [facility] Patient Information 2015 [place], [place]. This information is not intended to replace advice given to you by your health care provider. Make sure you discuss any questions you have with your health care provider.

## 2014-08-19 NOTE — ED Notes (Signed)
Abdominal cramping x 1 week relieved with Ibuprofen.  Vaginal discharge x 3 days

## 2014-08-20 LAB — GC/CHLAMYDIA PROBE AMP (~~LOC~~) NOT AT ARMC
CHLAMYDIA, DNA PROBE: NEGATIVE
NEISSERIA GONORRHEA: POSITIVE — AB

## 2014-08-23 ENCOUNTER — Telehealth (HOSPITAL_COMMUNITY): Payer: Self-pay

## 2014-08-23 NOTE — ED Notes (Signed)
Positive for gonorrhea. Treated per protocol.  Informed. Advised to abstain from sexual activity x 10 days and to notify partner(s) for testing and treatment. 

## 2014-09-27 ENCOUNTER — Encounter (HOSPITAL_BASED_OUTPATIENT_CLINIC_OR_DEPARTMENT_OTHER): Payer: Self-pay | Admitting: *Deleted

## 2014-09-27 ENCOUNTER — Emergency Department (HOSPITAL_BASED_OUTPATIENT_CLINIC_OR_DEPARTMENT_OTHER)
Admission: EM | Admit: 2014-09-27 | Discharge: 2014-09-27 | Disposition: A | Discharge status: Home / Self Care | Payer: Self-pay | Attending: Emergency Medicine | Admitting: Emergency Medicine

## 2014-09-27 DIAGNOSIS — Z7952 Long term (current) use of systemic steroids: Secondary | ICD-10-CM | POA: Insufficient documentation

## 2014-09-27 DIAGNOSIS — Z79899 Other long term (current) drug therapy: Secondary | ICD-10-CM | POA: Insufficient documentation

## 2014-09-27 DIAGNOSIS — J020 Streptococcal pharyngitis: Secondary | ICD-10-CM | POA: Insufficient documentation

## 2014-09-27 DIAGNOSIS — Z720 Tobacco use: Secondary | ICD-10-CM | POA: Insufficient documentation

## 2014-09-27 DIAGNOSIS — R000 Tachycardia, unspecified: Secondary | ICD-10-CM | POA: Insufficient documentation

## 2014-09-27 LAB — RAPID STREP SCREEN (MED CTR MEBANE ONLY): STREPTOCOCCUS, GROUP A SCREEN (DIRECT): POSITIVE — AB

## 2014-09-27 MED ORDER — DEXAMETHASONE 1 MG/ML PO CONC
10.0000 mg | Freq: Once | ORAL | Status: AC
  Administered 2014-09-27: 10 mg via ORAL
  Filled 2014-09-27: qty 1

## 2014-09-27 MED ORDER — MAGIC MOUTHWASH: 5.0000 mL | Freq: Three times a day (TID) | ORAL | Status: DC | PRN

## 2014-09-27 MED ORDER — ACETAMINOPHEN 500 MG PO TABS: 500.0000 mg | ORAL_TABLET | Freq: Four times a day (QID) | ORAL | Status: DC | PRN

## 2014-09-27 MED ORDER — IBUPROFEN 800 MG PO TABS: 800.0000 mg | ORAL_TABLET | Freq: Three times a day (TID) | ORAL | Status: DC

## 2014-09-27 MED ORDER — PENICILLIN G BENZATHINE 1200000 UNIT/2ML IM SUSP
1.2000 10*6.[IU] | Freq: Once | INTRAMUSCULAR | Status: AC
  Administered 2014-09-27: 1.2 10*6.[IU] via INTRAMUSCULAR
  Filled 2014-09-27: qty 2

## 2014-09-27 MED ORDER — IBUPROFEN 800 MG PO TABS
800.0000 mg | ORAL_TABLET | Freq: Once | ORAL | Status: AC
  Administered 2014-09-27: 800 mg via ORAL
  Filled 2014-09-27: qty 1

## 2014-09-27 NOTE — ED Provider Notes (Signed)
CSN: [id number]     Arrival date & time 09/27/14  1423 History   First MD Initiated Contact with Patient 09/27/14 1450     Chief Complaint  Patient presents with  . Sore Throat     (Consider location/radiation/quality/duration/timing/severity/associated sxs/prior Treatment) HPI Comments: Patient is a 24 year old female presenting to the emergency department for sore throat, fever, chills that began yesterday. No medication chart prior to arrival. No modifying factors identified. Pain is worsened with eating and drinking. Patient's daughter is sick at home with strep throat.   Patient is a 24 y.o. female presenting with pharyngitis.  Sore Throat This is a new problem. The current episode started yesterday. The problem occurs constantly. The problem has been unchanged. Associated symptoms include chills and a fever. Pertinent negatives include no coughing or vomiting. The symptoms are aggravated by eating and drinking. She has tried nothing for the symptoms. The treatment provided no relief.    History reviewed. No pertinent past medical history. History reviewed. No pertinent past surgical history. No family history on file. History  Substance Use Topics  . Smoking status: Current Every Day Smoker -- 0.25 packs/day    Types: Cigarettes  . Smokeless tobacco: Never Used  . Alcohol Use: Yes     Comment: socially   OB History    No data available     Review of Systems  Constitutional: Positive for fever and chills.  Respiratory: Negative for cough.   Gastrointestinal: Negative for vomiting.  All other systems reviewed and are negative.     Allergies  Review of patient's allergies indicates no known allergies.  Home Medications   Prior to Admission medications   Medication Sig Start Date End Date Taking? Authorizing Provider  acetaminophen (TYLENOL) 500 MG tablet Take 1 tablet (500 mg total) by mouth every 6 (six) hours as needed. 09/27/14   Deyanna Mctier, PA-C  Alum &  Mag Hydroxide-Simeth (MAGIC MOUTHWASH) SOLN Take 5 mLs by mouth 3 (three) times daily as needed for mouth pain. 09/27/14   Carey Lafon, PA-C  clindamycin (CLEOCIN) 150 MG capsule Take 1 capsule (150 mg total) by mouth every 6 (six) hours. 11/07/13   Tiffany Neva Seat, PA-C  diphenhydrAMINE (BENADRYL) 25 MG tablet Take 1 tablet (25 mg total) by mouth every 6 (six) hours. 11/17/13   Tiffany Neva Seat, PA-C  famotidine (PEPCID) 20 MG tablet Take 1 tablet (20 mg total) by mouth 2 (two) times daily. 11/17/13   Marlon Pel, PA-C  hydrOXYzine (ATARAX/VISTARIL) 25 MG tablet Take 1 tablet (25 mg total) by mouth every 6 (six) hours. 11/07/13   Tiffany Neva Seat, PA-C  ibuprofen (ADVIL,MOTRIN) 800 MG tablet Take 1 tablet (800 mg total) by mouth 3 (three) times daily. 09/27/14   Artha Stavros, PA-C  predniSONE (DELTASONE) 10 MG tablet Take 2 tablets (20 mg total) by mouth daily. 11/17/13   Tiffany Neva Seat, PA-C   BP 135/76 mmHg  Pulse 115  Temp(Src) 102.2 F (39 C) (Oral)  Resp 18  Ht 5' 8.5" (1.74 m)  Wt 270 lb (122.471 kg)  BMI 40.45 kg/m2  SpO2 96%  LMP 09/04/2014 Physical Exam  Constitutional: She is oriented to person, place, and time. She appears well-developed and well-nourished. No distress.  HENT:  Head: Normocephalic and atraumatic.  Right Ear: External ear normal.  Left Ear: External ear normal.  Nose: Nose normal.  Mouth/Throat: Oropharyngeal exudate present.  Eyes: Conjunctivae are normal.  Neck: Normal range of motion. Neck supple.  No nuchal rigidity.   Cardiovascular: Regular  rhythm and normal heart sounds.  Tachycardia present.   Pulmonary/Chest: Effort normal and breath sounds normal.  Abdominal: Soft.  Musculoskeletal: Normal range of motion.  Lymphadenopathy:    She has cervical adenopathy.  Neurological: She is alert and oriented to person, place, and time.  Skin: Skin is warm and dry. She is not diaphoretic.  Psychiatric: She has a normal mood and affect.  Nursing note  and vitals reviewed.   ED Course  Procedures (including critical care time) Medications  ibuprofen (ADVIL,MOTRIN) tablet 800 mg (800 mg Oral Given 09/27/14 1519)  penicillin g benzathine (BICILLIN LA) 1200000 UNIT/2ML injection 1.2 Million Units (1.2 Million Units Intramuscular Given 09/27/14 1520)  dexamethasone (DECADRON) 1 MG/ML solution 10 mg (10 mg Oral Given 09/27/14 1517)    Labs Review Labs Reviewed  RAPID STREP SCREEN (NOT AT [facility]) - Abnormal; Notable for the following:    Streptococcus, Group A Screen (Direct) POSITIVE (*)    All other components within normal limits    Imaging Review No results found.   EKG Interpretation None      MDM   Final diagnoses:  Strep pharyngitis    Filed Vitals:   09/27/14 1428  BP: 135/76  Pulse: 115  Temp: 102.2 F (39 C)  Resp: 18   Pt febrile with tonsillar exudate, cervical lymphadenopathy, & dysphagia; diagnosis of strep. Treated in the Ed with steroids, NSAIDs, Pain medication and PCN IM.  Tachycardia likely secondary to fever.Pt appears mildly dehydrated, discussed importance of water rehydration. Presentation non concerning for PTA or infxn spread to soft tissue. No trismus or uvula deviation. Specific return precautions discussed. Pt able to drink water in ED without difficulty with intact air way. Recommended PCP follow up. Patient is stable at time of discharge      Francee PiccoloJennifer Matt Delpizzo, PA-C 09/27/14 1610  Toy CookeyMegan Docherty, MD 09/29/14 1110

## 2014-09-27 NOTE — Discharge Instructions (Signed)
Please follow up with your primary care physician in 1-2 days. If you do not have one please call the [facility] number listed above. Please alternate between Motrin and Tylenol every three hours for fevers and pain. Please read all discharge instructions and return precautions.  ° °Pharyngitis °Pharyngitis is redness, pain, and swelling (inflammation) of your pharynx.  °CAUSES  °Pharyngitis is usually caused by infection. Most of the time, these infections are from viruses (viral) and are part of a cold. However, sometimes pharyngitis is caused by bacteria (bacterial). Pharyngitis can also be caused by allergies. Viral pharyngitis may be spread from person to person by coughing, sneezing, and personal items or utensils (cups, forks, spoons, toothbrushes). Bacterial pharyngitis may be spread from person to person by more intimate contact, such as kissing.  °SIGNS AND SYMPTOMS  °Symptoms of pharyngitis include:   °· Sore throat.   °· Tiredness (fatigue).   °· Low-grade fever.   °· Headache. °· Joint pain and muscle aches. °· Skin rashes. °· Swollen lymph nodes. °· Plaque-like film on throat or tonsils (often seen with bacterial pharyngitis). °DIAGNOSIS  °Your health care provider will ask you questions about your illness and your symptoms. Your medical history, along with a physical exam, is often all that is needed to diagnose pharyngitis. Sometimes, a rapid strep test is done. Other lab tests may also be done, depending on the suspected cause.  °TREATMENT  °Viral pharyngitis will usually get better in 3-4 days without the use of medicine. Bacterial pharyngitis is treated with medicines that kill germs (antibiotics).  °HOME CARE INSTRUCTIONS  °· Drink enough water and fluids to keep your urine clear or pale yellow.   °· Only take over-the-counter or prescription medicines as directed by your health care provider:   °¨ If you are prescribed antibiotics, make sure you finish them even if you start  to feel better.   °¨ Do not take aspirin.   °· Get lots of rest.   °· Gargle with 8 oz of salt water (½ tsp of salt per 1 qt of water) as often as every 1-2 hours to soothe your throat.   °· Throat lozenges (if you are not at risk for choking) or sprays may be used to soothe your throat. °SEEK MEDICAL CARE IF:  °· You have large, tender lumps in your neck. °· You have a rash. °· You cough up green, yellow-brown, or bloody spit. °SEEK IMMEDIATE MEDICAL CARE IF:  °· Your neck becomes stiff. °· You drool or are unable to swallow liquids. °· You vomit or are unable to keep medicines or liquids down. °· You have severe pain that does not go away with the use of recommended medicines. °· You have trouble breathing (not caused by a stuffy nose). °MAKE SURE YOU:  °· Understand these instructions. °· Will watch your condition. °· Will get help right away if you are not doing well or get worse. °Document Released: 04/09/2005 Document Revised: 01/28/2013 Document Reviewed: 12/15/2012 °ExitCare® Patient Information ©2015 [employer]. This information is not intended to replace advice given to you by your health care provider. Make sure you discuss any questions you have with your health care provider. ° °

## 2014-09-27 NOTE — ED Notes (Signed)
Sore throat, fever and chills since yesterday.

## 2014-09-27 NOTE — ED Notes (Signed)
No reaction noted on Pt. Skin and no shortness of breath

## 2014-09-27 NOTE — ED Notes (Signed)
Pt. To be discharged in 15 mins if no reaction noted to injection given.

## 2015-06-30 ENCOUNTER — Emergency Department (HOSPITAL_BASED_OUTPATIENT_CLINIC_OR_DEPARTMENT_OTHER): Payer: Self-pay

## 2015-06-30 ENCOUNTER — Encounter (HOSPITAL_BASED_OUTPATIENT_CLINIC_OR_DEPARTMENT_OTHER): Payer: Self-pay | Admitting: *Deleted

## 2015-06-30 ENCOUNTER — Emergency Department (HOSPITAL_BASED_OUTPATIENT_CLINIC_OR_DEPARTMENT_OTHER)
Admission: EM | Admit: 2015-06-30 | Discharge: 2015-06-30 | Disposition: A | Discharge status: Home / Self Care | Payer: Self-pay | Attending: Emergency Medicine | Admitting: Emergency Medicine

## 2015-06-30 DIAGNOSIS — H6123 Impacted cerumen, bilateral: Secondary | ICD-10-CM | POA: Insufficient documentation

## 2015-06-30 DIAGNOSIS — R059 Cough, unspecified: Secondary | ICD-10-CM

## 2015-06-30 DIAGNOSIS — Z791 Long term (current) use of non-steroidal anti-inflammatories (NSAID): Secondary | ICD-10-CM | POA: Insufficient documentation

## 2015-06-30 DIAGNOSIS — Z79899 Other long term (current) drug therapy: Secondary | ICD-10-CM | POA: Insufficient documentation

## 2015-06-30 DIAGNOSIS — Z7952 Long term (current) use of systemic steroids: Secondary | ICD-10-CM | POA: Insufficient documentation

## 2015-06-30 DIAGNOSIS — B9789 Other viral agents as the cause of diseases classified elsewhere: Secondary | ICD-10-CM

## 2015-06-30 DIAGNOSIS — F17210 Nicotine dependence, cigarettes, uncomplicated: Secondary | ICD-10-CM | POA: Insufficient documentation

## 2015-06-30 DIAGNOSIS — R05 Cough: Secondary | ICD-10-CM

## 2015-06-30 DIAGNOSIS — J069 Acute upper respiratory infection, unspecified: Secondary | ICD-10-CM | POA: Insufficient documentation

## 2015-06-30 DIAGNOSIS — R112 Nausea with vomiting, unspecified: Secondary | ICD-10-CM | POA: Insufficient documentation

## 2015-06-30 DIAGNOSIS — R42 Dizziness and giddiness: Secondary | ICD-10-CM | POA: Insufficient documentation

## 2015-06-30 MED ORDER — SODIUM CHLORIDE 0.9 % IV BOLUS (SEPSIS)
1000.0000 mL | Freq: Once | INTRAVENOUS | Status: AC
  Administered 2015-06-30: 1000 mL via INTRAVENOUS

## 2015-06-30 MED ORDER — ONDANSETRON HCL 4 MG/2ML IJ SOLN
4.0000 mg | Freq: Once | INTRAMUSCULAR | Status: AC
  Administered 2015-06-30: 4 mg via INTRAVENOUS
  Filled 2015-06-30: qty 2

## 2015-06-30 NOTE — ED Notes (Signed)
Earache last week. Blood streaks when she blows her nose. Body aches. Raspy voice x 2 days. Chills and vomiting.

## 2015-06-30 NOTE — ED Provider Notes (Signed)
CSN: [id number]     Arrival date & time 06/30/15  1207 History   First MD Initiated Contact with Patient 06/30/15 1325     Chief Complaint  Patient presents with  . Emesis     (Consider location/radiation/quality/duration/timing/severity/associated sxs/prior Treatment) HPI Comments: Patient presents today with complaints of cough, chills, nausea, vomiting, body aches, decreased appetite, and feeling lightheaded.  She reports onset of symptoms yesterday, which are gradually worsening.  She reports that she had three episodes of vomiting yesterday, but no vomiting today.  She reports that she thinks that she saw a small streak of bright red blood in her sputum today while coughing, which prompted her to come to the ED.  She has been taking an OTC Tylenol Cold and Flu for her symptoms without relief.  She denies chest pain, SOB, sinus pain, ear pain, sore throat, headache, fever, diarrhea, abdominal pain, urinary symptoms  Patient is a 25 y.o. female presenting with vomiting. The history is provided by the patient.  Emesis   History reviewed. No pertinent past medical history. History reviewed. No pertinent past surgical history. No family history on file. Social History  Substance Use Topics  . Smoking status: Current Every Day Smoker -- 0.25 packs/day    Types: Cigarettes  . Smokeless tobacco: Never Used  . Alcohol Use: Yes     Comment: socially   OB History    No data available     Review of Systems  Gastrointestinal: Positive for vomiting.  All other systems reviewed and are negative.     Allergies  Review of patient's allergies indicates no known allergies.  Home Medications   Prior to Admission medications   Medication Sig Start Date End Date Taking? Authorizing Provider  acetaminophen (TYLENOL) 500 MG tablet Take 1 tablet (500 mg total) by mouth every 6 (six) hours as needed. 09/27/14   Jennifer Piepenbrink, PA-C  Alum & Mag Hydroxide-Simeth (MAGIC MOUTHWASH) SOLN Take  5 mLs by mouth 3 (three) times daily as needed for mouth pain. 09/27/14   Jennifer Piepenbrink, PA-C  clindamycin (CLEOCIN) 150 MG capsule Take 1 capsule (150 mg total) by mouth every 6 (six) hours. 11/07/13   Tiffany Neva SeatGreene, PA-C  diphenhydrAMINE (BENADRYL) 25 MG tablet Take 1 tablet (25 mg total) by mouth every 6 (six) hours. 11/17/13   Tiffany Neva SeatGreene, PA-C  famotidine (PEPCID) 20 MG tablet Take 1 tablet (20 mg total) by mouth 2 (two) times daily. 11/17/13   Marlon Peliffany Greene, PA-C  hydrOXYzine (ATARAX/VISTARIL) 25 MG tablet Take 1 tablet (25 mg total) by mouth every 6 (six) hours. 11/07/13   Tiffany Neva SeatGreene, PA-C  ibuprofen (ADVIL,MOTRIN) 800 MG tablet Take 1 tablet (800 mg total) by mouth 3 (three) times daily. 09/27/14   Jennifer Piepenbrink, PA-C  predniSONE (DELTASONE) 10 MG tablet Take 2 tablets (20 mg total) by mouth daily. 11/17/13   Tiffany Neva SeatGreene, PA-C   BP 116/75 mmHg  Pulse 72  Temp(Src) 98 F (36.7 C) (Oral)  Resp 16  Ht 5' 7.5" (1.715 m)  Wt 129.729 kg  BMI 44.11 kg/m2  SpO2 97%  LMP 06/23/2015 Physical Exam  Constitutional: She appears well-developed and well-nourished.  HENT:  Head: Normocephalic and atraumatic.  Mouth/Throat: Oropharynx is clear and moist.  Bilateral cerumen impaction  Eyes: EOM are normal. Pupils are equal, round, and reactive to light.  Neck: Normal range of motion. Neck supple.  Cardiovascular: Normal rate, regular rhythm and normal heart sounds.   Pulmonary/Chest: Effort normal and breath sounds normal. No  respiratory distress. She has no wheezes. She has no rales. She exhibits no tenderness.  Abdominal: Soft. Bowel sounds are normal. She exhibits no distension. There is no tenderness.  Musculoskeletal: Normal range of motion.  Neurological: She is alert. She has normal strength. No cranial nerve deficit or sensory deficit.  Skin: Skin is warm and dry.  Psychiatric: She has a normal mood and affect.  Nursing note and vitals reviewed.   ED Course   Procedures (including critical care time) Labs Review Labs Reviewed - No data to display  Imaging Review Dg Chest 2 View  06/30/2015  CLINICAL DATA:  Weakness, cough, body aches, vomiting.  Smoker. EXAM: CHEST  2 VIEW COMPARISON:  None. FINDINGS: Cardiomediastinal silhouette is normal in size and configuration. Lungs are clear. Lung volumes are normal. No evidence of pneumonia. No pleural effusion. No pneumothorax. Osseous and soft tissue structures about the chest are unremarkable. IMPRESSION: No active cardiopulmonary disease. Electronically Signed   By: Bary Richard M.D.   On: 06/30/2015 14:18   I have personally reviewed and evaluated these images and lab results as part of my medical decision-making.   EKG Interpretation None     3:07 PM Patient tolerating PO liquids.   MDM   Final diagnoses:  Cough   Patient presents today with complaints of cough, body aches, nausea, vomiting, and lightheadedness.  Onset of symptoms yesterday.  VSS.  No hypoxia.  She denies CP or SOB.  Chest xray is negative.  Nausea and lightheadedness improved with IVF and IV Zofran.  Patient tolerating PO liquids.  Symptoms most consistent with Viral Illness.  Feel that the patient is stable for discharge.  Return precautions given.     Santiago Glad, PA-C 06/30/15 2308  Vanetta Mulders, MD 07/01/15 [phone]

## 2015-06-30 NOTE — ED Notes (Signed)
Pt given d/c instructions as per chart. Verbalizes understanding. No questions. 

## 2015-07-05 ENCOUNTER — Emergency Department (HOSPITAL_BASED_OUTPATIENT_CLINIC_OR_DEPARTMENT_OTHER)
Admission: EM | Admit: 2015-07-05 | Discharge: 2015-07-05 | Disposition: A | Discharge status: Home / Self Care | Payer: No Typology Code available for payment source | Attending: Emergency Medicine | Admitting: Emergency Medicine

## 2015-07-05 ENCOUNTER — Encounter (HOSPITAL_BASED_OUTPATIENT_CLINIC_OR_DEPARTMENT_OTHER): Payer: Self-pay | Admitting: *Deleted

## 2015-07-05 ENCOUNTER — Emergency Department (HOSPITAL_BASED_OUTPATIENT_CLINIC_OR_DEPARTMENT_OTHER): Payer: No Typology Code available for payment source

## 2015-07-05 DIAGNOSIS — Y92410 Unspecified street and highway as the place of occurrence of the external cause: Secondary | ICD-10-CM | POA: Diagnosis not present

## 2015-07-05 DIAGNOSIS — Z791 Long term (current) use of non-steroidal anti-inflammatories (NSAID): Secondary | ICD-10-CM | POA: Insufficient documentation

## 2015-07-05 DIAGNOSIS — S199XXA Unspecified injury of neck, initial encounter: Secondary | ICD-10-CM | POA: Diagnosis present

## 2015-07-05 DIAGNOSIS — Z3202 Encounter for pregnancy test, result negative: Secondary | ICD-10-CM | POA: Insufficient documentation

## 2015-07-05 DIAGNOSIS — Z7952 Long term (current) use of systemic steroids: Secondary | ICD-10-CM | POA: Diagnosis not present

## 2015-07-05 DIAGNOSIS — S39012A Strain of muscle, fascia and tendon of lower back, initial encounter: Secondary | ICD-10-CM | POA: Insufficient documentation

## 2015-07-05 DIAGNOSIS — Z79899 Other long term (current) drug therapy: Secondary | ICD-10-CM | POA: Diagnosis not present

## 2015-07-05 DIAGNOSIS — Y998 Other external cause status: Secondary | ICD-10-CM | POA: Insufficient documentation

## 2015-07-05 DIAGNOSIS — F17210 Nicotine dependence, cigarettes, uncomplicated: Secondary | ICD-10-CM | POA: Insufficient documentation

## 2015-07-05 DIAGNOSIS — S161XXA Strain of muscle, fascia and tendon at neck level, initial encounter: Secondary | ICD-10-CM | POA: Diagnosis not present

## 2015-07-05 DIAGNOSIS — Y9389 Activity, other specified: Secondary | ICD-10-CM | POA: Insufficient documentation

## 2015-07-05 LAB — PREGNANCY, URINE: Preg Test, Ur: NEGATIVE

## 2015-07-05 MED ORDER — IBUPROFEN 800 MG PO TABS: 800.0000 mg | ORAL_TABLET | Freq: Three times a day (TID) | ORAL | Status: DC | PRN

## 2015-07-05 NOTE — Discharge Instructions (Signed)
Return here as needed. Follow up with a primary doctor. Ice and heat on the areas that are sore. °
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
## 2015-07-05 NOTE — ED Notes (Signed)
Patient transported to X-ray 

## 2015-07-05 NOTE — ED Notes (Signed)
Pt returned from xray

## 2015-07-19 ENCOUNTER — Emergency Department (HOSPITAL_BASED_OUTPATIENT_CLINIC_OR_DEPARTMENT_OTHER)
Admission: EM | Admit: 2015-07-19 | Discharge: 2015-07-19 | Disposition: A | Discharge status: Home / Self Care | Payer: No Typology Code available for payment source | Attending: Emergency Medicine | Admitting: Emergency Medicine

## 2015-07-19 ENCOUNTER — Encounter (HOSPITAL_BASED_OUTPATIENT_CLINIC_OR_DEPARTMENT_OTHER): Payer: Self-pay | Admitting: *Deleted

## 2015-07-19 DIAGNOSIS — R42 Dizziness and giddiness: Secondary | ICD-10-CM | POA: Insufficient documentation

## 2015-07-19 DIAGNOSIS — H9201 Otalgia, right ear: Secondary | ICD-10-CM | POA: Insufficient documentation

## 2015-07-19 DIAGNOSIS — Z79899 Other long term (current) drug therapy: Secondary | ICD-10-CM | POA: Insufficient documentation

## 2015-07-19 DIAGNOSIS — Z7952 Long term (current) use of systemic steroids: Secondary | ICD-10-CM | POA: Insufficient documentation

## 2015-07-19 DIAGNOSIS — F17210 Nicotine dependence, cigarettes, uncomplicated: Secondary | ICD-10-CM | POA: Insufficient documentation

## 2015-07-19 DIAGNOSIS — H6121 Impacted cerumen, right ear: Secondary | ICD-10-CM | POA: Insufficient documentation

## 2015-07-19 NOTE — ED Notes (Signed)
Another patient in fast track states that pt said she was leaving to pick up her children from school.

## 2015-07-19 NOTE — ED Notes (Signed)
Ear pain right ear and slight dizziness since yesterday.

## 2015-07-19 NOTE — ED Notes (Signed)
She was here a few hours ago for ear pain but had to leave. Was not seen. Returned to be seen for ear pain.

## 2015-07-19 NOTE — ED Notes (Signed)
Pt is not in Fast track at time RN in to assess the Pt.

## 2015-07-20 ENCOUNTER — Emergency Department (HOSPITAL_BASED_OUTPATIENT_CLINIC_OR_DEPARTMENT_OTHER)
Admission: EM | Admit: 2015-07-20 | Discharge: 2015-07-20 | Disposition: A | Discharge status: Home / Self Care | Payer: No Typology Code available for payment source | Attending: Emergency Medicine | Admitting: Emergency Medicine

## 2015-07-20 DIAGNOSIS — H6121 Impacted cerumen, right ear: Secondary | ICD-10-CM

## 2015-07-20 NOTE — ED Notes (Signed)
Pt. Assessed by PA Glo Herring Jeff and treated according.  Pt. To be discharged with instructions.

## 2015-07-20 NOTE — ED Notes (Signed)
Pt. Reports being able to hear well out of the R ear at time of discharge.

## 2015-07-20 NOTE — ED Provider Notes (Signed)
CSN: [id number]     Arrival date & time 07/19/15  2151 History   First MD Initiated Contact with Patient 07/20/15 0055     Chief Complaint  Patient presents with  . Otalgia    HPI   25 year old female presents today with right ear pain. Patient reports her last several-day she's had progressive right ear pain, worse with burping, coughing, change in internal ear pressure. She reports when she swallows or burps she feels a sharp pain in her right ear, she spent using over-the-counter earwax softening drops for the last 2 days but continued to have pain. Patient denies any upper respiratory complaints, denies any fever or chills, nausea or vomiting. No history of the same. Patient reports difficulty hearing out of her right ear.  History reviewed. No pertinent past medical history. History reviewed. No pertinent past surgical history. No family history on file. Social History  Substance Use Topics  . Smoking status: Current Every Day Smoker -- 0.25 packs/day    Types: Cigarettes  . Smokeless tobacco: Never Used  . Alcohol Use: Yes     Comment: socially   OB History    No data available     Review of Systems  All other systems reviewed and are negative.   Allergies  Review of patient's allergies indicates no known allergies.  Home Medications   Prior to Admission medications   Medication Sig Start Date End Date Taking? Authorizing Provider  acetaminophen (TYLENOL) 500 MG tablet Take 1 tablet (500 mg total) by mouth every 6 (six) hours as needed. 09/27/14   Jennifer Piepenbrink, PA-C  Alum & Mag Hydroxide-Simeth (MAGIC MOUTHWASH) SOLN Take 5 mLs by mouth 3 (three) times daily as needed for mouth pain. 09/27/14   Jennifer Piepenbrink, PA-C  clindamycin (CLEOCIN) 150 MG capsule Take 1 capsule (150 mg total) by mouth every 6 (six) hours. 11/07/13   Tiffany Neva Seat, PA-C  diphenhydrAMINE (BENADRYL) 25 MG tablet Take 1 tablet (25 mg total) by mouth every 6 (six) hours. 11/17/13   Tiffany  Neva Seat, PA-C  famotidine (PEPCID) 20 MG tablet Take 1 tablet (20 mg total) by mouth 2 (two) times daily. 11/17/13   Marlon Pel, PA-C  hydrOXYzine (ATARAX/VISTARIL) 25 MG tablet Take 1 tablet (25 mg total) by mouth every 6 (six) hours. 11/07/13   Tiffany Neva Seat, PA-C  ibuprofen (ADVIL,MOTRIN) 800 MG tablet Take 1 tablet (800 mg total) by mouth every 8 (eight) hours as needed. 07/05/15   Charlestine Night, PA-C  predniSONE (DELTASONE) 10 MG tablet Take 2 tablets (20 mg total) by mouth daily. 11/17/13   Tiffany Neva Seat, PA-C   BP 130/100 mmHg  Pulse 71  Temp(Src) 98.7 F (37.1 C) (Oral)  Resp 18  Ht  (1.727 m)  Wt 128.822 kg  BMI 43.19 kg/m2  SpO2 100%  LMP 06/23/2015 Physical Exam  Constitutional: She is oriented to person, place, and time. She appears well-developed and well-nourished.  HENT:  Head: Normocephalic and atraumatic.  Cerumen impaction right external canal  Eyes: Conjunctivae are normal. Pupils are equal, round, and reactive to light. Right eye exhibits no discharge. Left eye exhibits no discharge. No scleral icterus.  Neck: Normal range of motion. No JVD present. No tracheal deviation present.  Pulmonary/Chest: Effort normal. No stridor.  Neurological: She is alert and oriented to person, place, and time. Coordination normal.  Psychiatric: She has a normal mood and affect. Her behavior is normal. Judgment and thought content normal.  Nursing note and vitals reviewed.   ED  Course  Procedures (including critical care time) Labs Review Labs Reviewed - No data to display  Imaging Review No results found. I have personally reviewed and evaluated these images and lab results as part of my medical decision-making.   EKG Interpretation None      MDM   Final diagnoses:  Cerumen impaction, right    Labs:  Imaging:  Consults:  Therapeutics:  Discharge Meds:   Assessment/Plan:25 year old female presents today with right cerumen impaction. I personally  irrigated the canal removing the cerumen impaction, patient had dramatic improvement in her symptoms. She no longer had ear pain, was able to hear out of her right ear. She had some erythema of the external canal but no obvious signs of infection this is likely due to the significant pressure caused by the cerumen impaction. Patient will monitor for signs of infection, follow up with her primary care provider if symptoms return. Patient verbalizes understanding and agreement to today's plan had no further questions concerns at time of discharge.         Eyvonne MechanicJeffrey Garvis Downum, PA-C 07/20/15 [id number]  Pricilla LovelessScott Goldston, MD 07/26/15 1125

## 2016-02-19 ENCOUNTER — Emergency Department (HOSPITAL_COMMUNITY)
Admission: EM | Admit: 2016-02-19 | Discharge: 2016-02-19 | Disposition: A | Discharge status: Home / Self Care | Payer: Self-pay | Attending: Emergency Medicine | Admitting: Emergency Medicine

## 2016-02-19 ENCOUNTER — Encounter (HOSPITAL_COMMUNITY): Payer: Self-pay

## 2016-02-19 ENCOUNTER — Emergency Department (HOSPITAL_COMMUNITY): Payer: Self-pay

## 2016-02-19 DIAGNOSIS — Z79899 Other long term (current) drug therapy: Secondary | ICD-10-CM | POA: Insufficient documentation

## 2016-02-19 DIAGNOSIS — R079 Chest pain, unspecified: Secondary | ICD-10-CM | POA: Insufficient documentation

## 2016-02-19 DIAGNOSIS — F17210 Nicotine dependence, cigarettes, uncomplicated: Secondary | ICD-10-CM | POA: Insufficient documentation

## 2016-02-19 NOTE — ED Triage Notes (Signed)
Pt c/o intermittent, sharp, central chest pain x 1 week.  Pain score 2/10.  Denies associated cardiac symptoms.  Pt has not taken anything for pain.  Nothing makes pain better or worse.  Hx of smoking.

## 2016-02-19 NOTE — ED Notes (Signed)
Patients CBG 91

## 2016-02-19 NOTE — ED Provider Notes (Signed)
[facility]-EMERGENCY DEPT Provider Note   CSN: [id number] Arrival date & time: 02/19/16  [id number]     History   Chief Complaint Chief Complaint  Patient presents with  . Chest Pain    HPI [name] is a 25 y.o. female.  Intermittent sharp central chest pain for 1 week not associated with any activity. Symptoms last approximately 10 minutes. No history of cardiac disease. Nothing makes symptoms better worse. No remitting or exacerbating factors. She is a smoker.      History reviewed. No pertinent past medical history.  There are no active problems to display for this patient.   History reviewed. No pertinent surgical history.  OB History    No data available       Home Medications    Prior to Admission medications   Medication Sig Start Date End Date Taking? Authorizing Provider  acetaminophen (TYLENOL) 500 MG tablet Take 1 tablet (500 mg total) by mouth every 6 (six) hours as needed. Patient not taking: Reported on 02/19/2016 09/27/14   Francee PiccoloJennifer Piepenbrink, PA-C  Alum & Mag Hydroxide-Simeth (MAGIC MOUTHWASH) SOLN Take 5 mLs by mouth 3 (three) times daily as needed for mouth pain. Patient not taking: Reported on 02/19/2016 09/27/14   Francee PiccoloJennifer Piepenbrink, PA-C  clindamycin (CLEOCIN) 150 MG capsule Take 1 capsule (150 mg total) by mouth every 6 (six) hours. Patient not taking: Reported on 02/19/2016 11/07/13   Marlon Peliffany Greene, PA-C  diphenhydrAMINE (BENADRYL) 25 MG tablet Take 1 tablet (25 mg total) by mouth every 6 (six) hours. Patient not taking: Reported on 02/19/2016 11/17/13   Marlon Peliffany Greene, PA-C  famotidine (PEPCID) 20 MG tablet Take 1 tablet (20 mg total) by mouth 2 (two) times daily. Patient not taking: Reported on 02/19/2016 11/17/13   Marlon Peliffany Greene, PA-C  hydrOXYzine (ATARAX/VISTARIL) 25 MG tablet Take 1 tablet (25 mg total) by mouth every 6 (six) hours. Patient not taking: Reported on 02/19/2016 11/07/13   Marlon Peliffany Greene, PA-C  ibuprofen (ADVIL,MOTRIN) 800 MG  tablet Take 1 tablet (800 mg total) by mouth every 8 (eight) hours as needed. Patient not taking: Reported on 02/19/2016 07/05/15   Charlestine Nighthristopher Lawyer, PA-C  predniSONE (DELTASONE) 10 MG tablet Take 2 tablets (20 mg total) by mouth daily. Patient not taking: Reported on 02/19/2016 11/17/13   Marlon Peliffany Greene, PA-C    Family History History reviewed. No pertinent family history.  Social History Social History  Substance Use Topics  . Smoking status: Current Every Day Smoker    Packs/day: 0.25    Types: Cigarettes  . Smokeless tobacco: Never Used  . Alcohol use Yes     Comment: socially     Allergies   Review of patient's allergies indicates no known allergies.   Review of Systems Review of Systems  All other systems reviewed and are negative.    Physical Exam Updated Vital Signs BP 129/90 (BP Location: Left Arm)   Pulse 74   Temp 98.3 F (36.8 C) (Oral)   Resp 25   Ht 5\' 9"  (1.753 m)   Wt 280 lb (127 kg)   SpO2 99%   BMI 41.35 kg/m   Physical Exam  Constitutional: She is oriented to person, place, and time. She appears well-developed and well-nourished.  Obese, no acute distress  HENT:  Head: Normocephalic and atraumatic.  Eyes: Conjunctivae are normal.  Neck: Neck supple.  Cardiovascular: Normal rate and regular rhythm.   Pulmonary/Chest: Effort normal and breath sounds normal.  Abdominal: Soft. Bowel sounds are normal.  Musculoskeletal:  Normal range of motion.  Neurological: She is alert and oriented to person, place, and time.  Skin: Skin is warm and dry.  Psychiatric: She has a normal mood and affect. Her behavior is normal.  Nursing note and vitals reviewed.    ED Treatments / Results  Labs (all labs ordered are listed, but only abnormal results are displayed) Labs Reviewed  CBG MONITORING, ED    EKG  EKG Interpretation  Date/Time:  Sunday February 19 2016 09:03:23 EDT Ventricular Rate:  76 PR Interval:    QRS Duration: 83 QT  Interval:  396 QTC Calculation: 446 R Axis:   67 Text Interpretation:  Sinus rhythm Confirmed by Adriana SimasOOK  MD, Janey Petron ([id number]) on 02/19/2016 9:53:57 AM       Radiology Dg Chest 2 View  Result Date: 02/19/2016 CLINICAL DATA:  Chest pain for 2 weeks EXAM: CHEST  2 VIEW COMPARISON:  06/30/2015 FINDINGS: Normal heart size. Lungs clear. No pneumothorax. No pleural effusion. IMPRESSION: No active cardiopulmonary disease. Electronically Signed   By: Jolaine ClickArthur  Hoss M.D.   On: 02/19/2016 09:44    Procedures Procedures (including critical care time)  Medications Ordered in ED Medications - No data to display   Initial Impression / Assessment and Plan / ED Course  I have reviewed the triage vital signs and the nursing notes.  Pertinent labs & imaging results that were available during my care of the patient were reviewed by me and considered in my medical decision making (see chart for details).  Clinical Course    Patient is low risk for ACS or PE. Glucose normal. EKG and chest x-ray normal. Encouraged patient to lose weight and stop smoking.  Final Clinical Impressions(s) / ED Diagnoses   Final diagnoses:  Chest pain, unspecified type    New Prescriptions New Prescriptions   No medications on file     Donnetta HutchingBrian Chantea Surace, MD 02/19/16 1036

## 2016-02-19 NOTE — Discharge Instructions (Signed)
Tests were all good. Try to lose weight by beginning a walking program. Also recommend stopping smoking

## 2016-02-20 LAB — CBG MONITORING, ED: Glucose-Capillary: 91 mg/dL (ref 65–99)

## 2016-06-02 ENCOUNTER — Encounter (HOSPITAL_COMMUNITY): Payer: Self-pay | Admitting: *Deleted

## 2016-06-02 ENCOUNTER — Ambulatory Visit (INDEPENDENT_AMBULATORY_CARE_PROVIDER_SITE_OTHER): Payer: Self-pay

## 2016-06-02 ENCOUNTER — Ambulatory Visit (HOSPITAL_COMMUNITY): Payer: Self-pay

## 2016-06-02 ENCOUNTER — Ambulatory Visit (HOSPITAL_COMMUNITY)
Admission: EM | Admit: 2016-06-02 | Discharge: 2016-06-02 | Disposition: A | Discharge status: Home / Self Care | Payer: Self-pay | Attending: Internal Medicine | Admitting: Internal Medicine

## 2016-06-02 DIAGNOSIS — S8251XA Displaced fracture of medial malleolus of right tibia, initial encounter for closed fracture: Secondary | ICD-10-CM

## 2016-06-02 MED ORDER — IBUPROFEN 800 MG PO TABS: 800.0000 mg | ORAL_TABLET | Freq: Three times a day (TID) | ORAL | 0 refills | Status: AC

## 2016-06-02 NOTE — ED Triage Notes (Signed)
Reports missing a step last night, injuring right ankle.  States improving some, but still painful weight bearing.

## 2016-06-02 NOTE — Progress Notes (Signed)
Orthopedic Tech Progress Note Patient Details:  Wynona Neatndreka L Worsley Aug 13, 1990 [id number]  Ortho Devices Type of Ortho Device: Ace wrap, Short leg splint Ortho Device/Splint Interventions: Application   Saul FordyceJennifer C Carey Johndrow 06/02/2016, 7:12 PM

## 2016-06-02 NOTE — ED Provider Notes (Signed)
CSN: [id number]     Arrival date & time 06/02/16  1546 History   None    Chief Complaint  Patient presents with  . Ankle Injury   (Consider location/radiation/quality/duration/timing/severity/associated sxs/prior Treatment) Patient was running when she missed a step and twisted her right ankle. She reports that this occurred Thursday (2 days ago). She reports the pain and swelling has improved and she is able to ambulate but reports that it hurt to bear weight on the right foot. She denies limited ROM.       History reviewed. No pertinent past medical history. History reviewed. No pertinent surgical history. No family history on file. Social History  Substance Use Topics  . Smoking status: Current Every Day Smoker    Packs/day: 0.25    Types: Cigarettes  . Smokeless tobacco: Never Used  . Alcohol use Yes     Comment: socially   OB History    No data available     Review of Systems  All other systems reviewed and are negative.   Allergies  Patient has no known allergies.  Home Medications   Prior to Admission medications   Medication Sig Start Date End Date Taking? Authorizing Provider  acetaminophen (TYLENOL) 500 MG tablet Take 1 tablet (500 mg total) by mouth every 6 (six) hours as needed. Patient not taking: Reported on 02/19/2016 09/27/14   Francee Piccolo, PA-C  Alum & Mag Hydroxide-Simeth (MAGIC MOUTHWASH) SOLN Take 5 mLs by mouth 3 (three) times daily as needed for mouth pain. Patient not taking: Reported on 02/19/2016 09/27/14   Francee Piccolo, PA-C  clindamycin (CLEOCIN) 150 MG capsule Take 1 capsule (150 mg total) by mouth every 6 (six) hours. Patient not taking: Reported on 02/19/2016 11/07/13   Marlon Pel, PA-C  diphenhydrAMINE (BENADRYL) 25 MG tablet Take 1 tablet (25 mg total) by mouth every 6 (six) hours. Patient not taking: Reported on 02/19/2016 11/17/13   Marlon Pel, PA-C  famotidine (PEPCID) 20 MG tablet Take 1 tablet (20 mg total) by  mouth 2 (two) times daily. Patient not taking: Reported on 02/19/2016 11/17/13   Marlon Pel, PA-C  hydrOXYzine (ATARAX/VISTARIL) 25 MG tablet Take 1 tablet (25 mg total) by mouth every 6 (six) hours. Patient not taking: Reported on 02/19/2016 11/07/13   Marlon Pel, PA-C  ibuprofen (ADVIL,MOTRIN) 800 MG tablet Take 1 tablet (800 mg total) by mouth every 8 (eight) hours as needed. Patient not taking: Reported on 02/19/2016 07/05/15   Charlestine Night, PA-C  predniSONE (DELTASONE) 10 MG tablet Take 2 tablets (20 mg total) by mouth daily. Patient not taking: Reported on 02/19/2016 11/17/13   Marlon Pel, PA-C   Meds Ordered and Administered this Visit  Medications - No data to display  BP 118/76   Pulse 63   Temp 98.1 F (36.7 C) (Oral)   Resp 16   SpO2 99%  No data found.   Physical Exam  Constitutional: She is oriented to person, place, and time. She appears well-developed and well-nourished.  Cardiovascular: Normal rate.   Pulmonary/Chest: Effort normal.  Musculoskeletal:  Lateral aspect of the R ankle is swollen and has slight pain on palpation. She has full ROM at the ankle and foot.   Neurological: She is alert and oriented to person, place, and time.  Nursing note and vitals reviewed.   Urgent Care Course     Procedures (including critical care time)  Labs Review Labs Reviewed - No data to display  Imaging Review Dg Ankle Complete Right  Result Date: 06/02/2016 CLINICAL DATA:  Pain and swelling after trauma. EXAM: RIGHT ANKLE - COMPLETE 3+ VIEW COMPARISON:  None. FINDINGS: Bilateral soft tissue swelling. No acute fracture seen. Calcifications distal to the fibula and tibia are consistent with age indeterminate avulsion injuries. IMPRESSION: No acute fracture seen. Sequela of age-indeterminate avulsion injuries. Electronically Signed   By: Gerome Samavid  Williams III M.D   On: 06/02/2016 18:19   Dg Foot Complete Right  Result Date: 06/02/2016 CLINICAL DATA:  Patient  states that she was running and fell twisting her right ankle and foot yesterday, pain and swelling. Some weight bearing. No priors EXAM: RIGHT FOOT COMPLETE - 3+ VIEW COMPARISON:  None. FINDINGS: No fracture or dislocation of mid foot or forefoot. The phalanges are normal. The calcaneus is normal. No soft tissue abnormality. Tiny ossific fragment adjacent to the medial malleolus. IMPRESSION: Potential small avulsion fracture from the medial malleolus. No foot fracture. Electronically Signed   By: Genevive BiStewart  Edmunds M.D.   On: 06/02/2016 18:19    MDM   1. Closed avulsion fracture of medial malleolus of right tibia, initial encounter    Xray shows potential small avulsion fracture from the medial malleolus. Patient informed of this. Short leg splint applied. Rest the extremity. Take ibuprofen PRN for pain.  Referred to Ortho next week for f/u. Call on Monday to schedule an appt with the orthopedic specialist ASAP.     Lucia EstelleFeng Taziah Difatta, NP 06/02/16 2055

## 2016-06-02 NOTE — ED Notes (Signed)
Ortho   Tech     In  Applied   splint
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
## 2018-05-14 ENCOUNTER — Encounter (HOSPITAL_COMMUNITY): Payer: Self-pay | Admitting: Family Medicine

## 2018-05-14 ENCOUNTER — Ambulatory Visit (HOSPITAL_COMMUNITY)
Admission: EM | Admit: 2018-05-14 | Discharge: 2018-05-14 | Disposition: A | Discharge status: Home / Self Care | Payer: Self-pay | Attending: Family Medicine | Admitting: Family Medicine

## 2018-05-14 DIAGNOSIS — H65111 Acute and subacute allergic otitis media (mucoid) (sanguinous) (serous), right ear: Secondary | ICD-10-CM | POA: Insufficient documentation

## 2018-05-14 DIAGNOSIS — J111 Influenza due to unidentified influenza virus with other respiratory manifestations: Secondary | ICD-10-CM | POA: Insufficient documentation

## 2018-05-14 MED ORDER — AMOXICILLIN 875 MG PO TABS: 875.0000 mg | ORAL_TABLET | Freq: Two times a day (BID) | ORAL | 0 refills | Status: DC

## 2018-05-14 MED ORDER — HYDROCODONE-HOMATROPINE 5-1.5 MG/5ML PO SYRP: 5.0000 mL | ORAL_SOLUTION | Freq: Four times a day (QID) | ORAL | 0 refills | Status: DC | PRN

## 2018-05-14 NOTE — ED Provider Notes (Signed)
[facility]    CSN: [id number] Arrival date & time: 05/14/18  0800     History   Chief Complaint Chief Complaint  Patient presents with  . Flu-Like Symptoms    HPI [name] is a 28 y.o. female.   This is an established [facility] urgent care patient, 28 year old woman, complaining of body aches and cough.  Has had a bit of a cold for 2 weeks and then on Sunday developed fever, myalgias, cough.  She has had some popping in her right ear.  Patient has chronic dental problems.  She tried to get on Medicaid at this point.  She works at [employer] and missed work today.     History reviewed. No pertinent past medical history.  There are no active problems to display for this patient.   History reviewed. No pertinent surgical history.  OB History   No obstetric history on file.      Home Medications    Prior to Admission medications   Medication Sig Start Date End Date Taking? Authorizing Provider  amoxicillin (AMOXIL) 875 MG tablet Take 1 tablet (875 mg total) by mouth 2 (two) times daily. 05/14/18   Elvina Sidle, MD  HYDROcodone-homatropine (HYDROMET) 5-1.5 MG/5ML syrup Take 5 mLs by mouth every 6 (six) hours as needed for cough. 05/14/18   Elvina Sidle, MD  ibuprofen (ADVIL,MOTRIN) 800 MG tablet Take 1 tablet (800 mg total) by mouth 3 (three) times daily. 06/02/16   Lucia Estelle, NP    Family History History reviewed. No pertinent family history.  Social History Social History   Tobacco Use  . Smoking status: Current Every Day Smoker    Packs/day: 0.25    Types: Cigarettes  . Smokeless tobacco: Never Used  Substance Use Topics  . Alcohol use: Yes    Comment: socially  . Drug use: No     Allergies   Patient has no known allergies.   Review of Systems Review of Systems   Physical Exam Triage Vital Signs ED Triage Vitals [05/14/18 0825]  Enc Vitals Group     BP 130/89     Pulse Rate 95     Resp 16     Temp 98.6 F (37  C)     Temp Source Oral     SpO2 100 %     Weight      Height      Head Circumference      Peak Flow      Pain Score      Pain Loc      Pain Edu?      Excl. in GC?    No data found.  Updated Vital Signs BP 130/89 (BP Location: Left Arm)   Pulse 95   Temp 98.6 F (37 C) (Oral)   Resp 16   SpO2 100%    Physical Exam Vitals signs and nursing note reviewed.  Constitutional:      General: She is not in acute distress.    Appearance: Normal appearance. She is not ill-appearing.  HENT:     Head: Normocephalic and atraumatic.     Comments: Right TM is dull with loss of landmarks, left TM is normal    Nose: Congestion present.     Mouth/Throat:     Mouth: Mucous membranes are moist.     Pharynx: Oropharynx is clear.  Eyes:     Conjunctiva/sclera: Conjunctivae normal.  Neck:     Musculoskeletal: Normal range of motion  and neck supple.  Cardiovascular:     Rate and Rhythm: Normal rate and regular rhythm.     Heart sounds: Normal heart sounds.  Pulmonary:     Effort: Pulmonary effort is normal.     Breath sounds: Rhonchi present.  Musculoskeletal: Normal range of motion.  Lymphadenopathy:     Cervical: No cervical adenopathy.  Skin:    General: Skin is warm and dry.  Neurological:     General: No focal deficit present.     Mental Status: She is alert.  Psychiatric:        Mood and Affect: Mood normal.      UC Treatments / Results  Labs (all labs ordered are listed, but only abnormal results are displayed) Labs Reviewed - No data to display  EKG None  Radiology No results found.  Procedures Procedures (including critical care time)  Medications Ordered in UC Medications - No data to display  Initial Impression / Assessment and Plan / UC Course  I have reviewed the triage vital signs and the nursing notes.  Pertinent labs & imaging results that were available during my care of the patient were reviewed by me and considered in my medical decision  making (see chart for details).    Final Clinical Impressions(s) / UC Diagnoses   Final diagnoses:  Influenza  Acute mucoid otitis media of right ear   Discharge Instructions   None    ED Prescriptions    Medication Sig Dispense Auth. Provider   amoxicillin (AMOXIL) 875 MG tablet Take 1 tablet (875 mg total) by mouth 2 (two) times daily. 20 tablet Elvina Sidle, MD   HYDROcodone-homatropine (HYDROMET) 5-1.5 MG/5ML syrup Take 5 mLs by mouth every 6 (six) hours as needed for cough. 60 mL Elvina Sidle, MD     Controlled Substance Prescriptions [place] Controlled Substance Registry consulted? Not Applicable   Elvina Sidle, MD 05/14/18 [phone]

## 2018-05-14 NOTE — ED Triage Notes (Signed)
Pt presents to [facility] for assessment of body aches, fever, chills, congestion, cough since Sunday.

## 2018-10-06 ENCOUNTER — Other Ambulatory Visit: Payer: Self-pay

## 2018-10-06 ENCOUNTER — Encounter (HOSPITAL_COMMUNITY): Payer: Self-pay

## 2018-10-06 ENCOUNTER — Ambulatory Visit (HOSPITAL_COMMUNITY)
Admission: EM | Admit: 2018-10-06 | Discharge: 2018-10-06 | Disposition: A | Discharge status: Home / Self Care | Payer: Self-pay | Attending: Physician Assistant | Admitting: Physician Assistant

## 2018-10-06 DIAGNOSIS — J029 Acute pharyngitis, unspecified: Secondary | ICD-10-CM | POA: Insufficient documentation

## 2018-10-06 LAB — POCT RAPID STREP A: Streptococcus, Group A Screen (Direct): NEGATIVE

## 2018-10-06 MED ORDER — CETIRIZINE HCL 10 MG PO TABS: 10.0000 mg | ORAL_TABLET | Freq: Every day | ORAL | 0 refills | Status: DC

## 2018-10-06 MED ORDER — LIDOCAINE VISCOUS HCL 2 % MT SOLN: 15.0000 mL | OROMUCOSAL | 0 refills | Status: DC | PRN

## 2018-10-06 NOTE — ED Provider Notes (Signed)
[facility]    CSN: [id number] Arrival date & time: 10/06/18  0827     History   Chief Complaint Chief Complaint  Patient presents with  . Appointment    830  . Sore Throat    HPI [name] is a 28 y.o. female.   Patient is a 28 year old female who presents today with sore throat.  This is been present for the past 4 days.  Symptoms have been constant and worsening.  She has had pain with swallowing.  She has been doing TheraFlu for symptoms and Chloraseptic spray.  She denies any associated cough, chest congestion, rhinorrhea, ear pain, fevers.  Daughter recently sick with viral type symptoms. No known COVID exposure.   ROS per HPI      History reviewed. No pertinent past medical history.  There are no active problems to display for this patient.   History reviewed. No pertinent surgical history.  OB History   No obstetric history on file.      Home Medications    Prior to Admission medications   Medication Sig Start Date End Date Taking? Authorizing Provider  cetirizine (ZYRTEC) 10 MG tablet Take 1 tablet (10 mg total) by mouth daily. 10/06/18   Dahlia ByesBast, Danajah Birdsell A, NP  ibuprofen (ADVIL,MOTRIN) 800 MG tablet Take 1 tablet (800 mg total) by mouth 3 (three) times daily. 06/02/16   Lucia EstelleZheng, Feng, NP  lidocaine (XYLOCAINE) 2 % solution Use as directed 15 mLs in the mouth or throat as needed for mouth pain. 10/06/18   Janace ArisBast, Jacub Waiters A, NP    Family History History reviewed. No pertinent family history.  Social History Social History   Tobacco Use  . Smoking status: Current Every Day Smoker    Packs/day: 0.25    Types: Cigarettes  . Smokeless tobacco: Never Used  Substance Use Topics  . Alcohol use: Yes    Comment: socially  . Drug use: No     Allergies   Patient has no known allergies.   Review of Systems Review of Systems   Physical Exam Triage Vital Signs ED Triage Vitals  Enc Vitals Group     BP 10/06/18 0854 119/88     Pulse Rate  10/06/18 0854 79     Resp 10/06/18 0854 18     Temp 10/06/18 0854 98.6 F (37 C)     Temp Source 10/06/18 0854 Oral     SpO2 10/06/18 0854 99 %     Weight 10/06/18 0849 260 lb (117.9 kg)     Height --      Head Circumference --      Peak Flow --      Pain Score 10/06/18 0849 10     Pain Loc --      Pain Edu? --      Excl. in GC? --    No data found.  Updated Vital Signs BP 119/88 (BP Location: Right Arm)   Pulse 79   Temp 98.6 F (37 C) (Oral)   Resp 18   Wt 260 lb (117.9 kg)   LMP 09/29/2018   SpO2 99%   BMI 38.40 kg/m   Visual Acuity Right Eye Distance:   Left Eye Distance:   Bilateral Distance:    Right Eye Near:   Left Eye Near:    Bilateral Near:     Physical Exam Vitals signs and nursing note reviewed.  Constitutional:      General: She is not in acute distress.  Appearance: She is well-developed. She is not ill-appearing, toxic-appearing or diaphoretic.  HENT:     Head: Normocephalic and atraumatic.     Right Ear: Tympanic membrane and ear canal normal.     Left Ear: Tympanic membrane and ear canal normal.     Mouth/Throat:     Pharynx: Uvula midline. Posterior oropharyngeal erythema present.     Tonsils: 1+ on the right. 1+ on the left.  Neck:     Musculoskeletal: Normal range of motion.  Cardiovascular:     Rate and Rhythm: Normal rate and regular rhythm.  Pulmonary:     Effort: Pulmonary effort is normal.     Breath sounds: Normal breath sounds.  Skin:    General: Skin is warm and dry.  Neurological:     Mental Status: She is alert.  Psychiatric:        Mood and Affect: Mood normal.      UC Treatments / Results  Labs (all labs ordered are listed, but only abnormal results are displayed) Labs Reviewed  CULTURE, GROUP A STREP [place] Day)  POCT RAPID STREP A    EKG None  Radiology No results found.  Procedures Procedures (including critical care time)  Medications Ordered in UC Medications - No data to display  Initial  Impression / Assessment and Plan / UC Course  I have reviewed the triage vital signs and the nursing notes.  Pertinent labs & imaging results that were available during my care of the patient were reviewed by me and considered in my medical decision making (see chart for details).     Rapid strep test negative.  Will send for culture. Most likely viral pharyngitis.  We will have her do lidocaine as needed for discomfort. Tylenol or ibuprofen for pain Recommended starting back on her allergy medicine to see if this helps with symptoms. Final Clinical Impressions(s) / UC Diagnoses   Final diagnoses:  Viral pharyngitis     Discharge Instructions     Your rapid strep test was negative. We will send for culture Use the lidocaine solution as needed for sore throat You can also take Tylenol or ibuprofen Zyrtec daily for allergies.     ED Prescriptions    Medication Sig Dispense Auth. Provider   lidocaine (XYLOCAINE) 2 % solution Use as directed 15 mLs in the mouth or throat as needed for mouth pain. 100 mL Pretty Weltman A, NP   cetirizine (ZYRTEC) 10 MG tablet Take 1 tablet (10 mg total) by mouth daily. 30 tablet Loura Halt A, NP     Controlled Substance Prescriptions [place] Controlled Substance Registry consulted? Not Applicable   Orvan July, NP 10/06/18 [phone]

## 2018-10-06 NOTE — Discharge Instructions (Signed)
Your rapid strep test was negative. We will send for culture Use the lidocaine solution as needed for sore throat You can also take Tylenol or ibuprofen Zyrtec daily for allergies.

## 2018-10-06 NOTE — ED Triage Notes (Signed)
Pt states she has a sore throat  X 4 days.

## 2018-10-07 LAB — CULTURE, GROUP A STREP (THRC)

## 2018-10-08 ENCOUNTER — Telehealth (HOSPITAL_COMMUNITY): Payer: Self-pay | Admitting: Emergency Medicine

## 2018-10-08 MED ORDER — PENICILLIN V POTASSIUM 500 MG PO TABS: 500.0000 mg | ORAL_TABLET | Freq: Two times a day (BID) | ORAL | 0 refills | Status: AC

## 2018-10-08 NOTE — Telephone Encounter (Signed)
Culture is positive for group A Strep germ.  Prescription for penicillin V 500mg  bid x 10d #20 no refills sent to the pharmacy of record. No working number on file, will send letter and try again.

## 2018-10-10 ENCOUNTER — Telehealth (HOSPITAL_COMMUNITY): Payer: Self-pay | Admitting: Emergency Medicine

## 2018-10-10 NOTE — Telephone Encounter (Signed)
Attempted to reach patient. Call cannot be completed  

## 2018-10-13 ENCOUNTER — Telehealth (HOSPITAL_COMMUNITY): Payer: Self-pay | Admitting: Emergency Medicine

## 2018-10-13 NOTE — Telephone Encounter (Signed)
Attempted to reach patient x3. Call cannot be completed. Letter sent on 6/17

## 2020-02-01 ENCOUNTER — Emergency Department (HOSPITAL_BASED_OUTPATIENT_CLINIC_OR_DEPARTMENT_OTHER)
Admission: EM | Admit: 2020-02-01 | Discharge: 2020-02-01 | Disposition: A | Discharge status: Home / Self Care | Payer: Medicaid Other | Attending: Emergency Medicine | Admitting: Emergency Medicine

## 2020-02-01 ENCOUNTER — Encounter (HOSPITAL_BASED_OUTPATIENT_CLINIC_OR_DEPARTMENT_OTHER): Payer: Self-pay | Admitting: *Deleted

## 2020-02-01 ENCOUNTER — Other Ambulatory Visit: Payer: Self-pay

## 2020-02-01 DIAGNOSIS — J029 Acute pharyngitis, unspecified: Secondary | ICD-10-CM | POA: Diagnosis present

## 2020-02-01 DIAGNOSIS — Z20822 Contact with and (suspected) exposure to covid-19: Secondary | ICD-10-CM | POA: Diagnosis not present

## 2020-02-01 DIAGNOSIS — F17210 Nicotine dependence, cigarettes, uncomplicated: Secondary | ICD-10-CM | POA: Insufficient documentation

## 2020-02-01 DIAGNOSIS — H1132 Conjunctival hemorrhage, left eye: Secondary | ICD-10-CM | POA: Diagnosis not present

## 2020-02-01 LAB — RESPIRATORY PANEL BY RT PCR (FLU A&B, COVID)
Influenza A by PCR: NEGATIVE
Influenza B by PCR: NEGATIVE
SARS Coronavirus 2 by RT PCR: NEGATIVE

## 2020-02-01 LAB — GROUP A STREP BY PCR: Group A Strep by PCR: NOT DETECTED

## 2020-02-01 NOTE — Discharge Instructions (Signed)
Please read and follow all provided instructions.  Your diagnoses today include:  1. Sore throat   2. Subconjunctival hemorrhage of left eye     Tests performed today include:  Strep test: was negative for strep throat  Covid test: results pending  Vital signs. See below for your results today.   Medications prescribed:   None  Home care instructions:  Please read the educational materials provided and follow any instructions contained in this packet.  Follow-up instructions: Please follow-up with your primary care provider as needed for further evaluation of your symptoms.  Return instructions:   Please return to the Emergency Department if you experience worsening symptoms.   Return if you have worsening problems swallowing, your neck becomes swollen, you cannot swallow your saliva or your voice becomes muffled.   Return with high persistent fever, persistent vomiting, or if you have trouble breathing.   Please return if you have any other emergent concerns.  Additional Information:  Your vital signs today were: BP 139/90   Pulse 60   Temp 98.9 F (37.2 C)   Resp 18   Ht 5\' 8"  (1.727 m)   Wt 130.2 kg   LMP 01/25/2020   SpO2 100%   BMI 43.64 kg/m  If your blood pressure (BP) was elevated above 135/85 this visit, please have this repeated by your doctor within one month. --------------

## 2020-02-01 NOTE — ED Notes (Signed)
Has had a sore throat for the past few days, pain is located at jaw area, rt and lt and has difficulty with swallowing. Speech is WNL, resp WNL

## 2020-02-01 NOTE — ED Triage Notes (Signed)
Co sore throat abd bil lear pain  x 2 days
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
## 2020-02-01 NOTE — ED Notes (Signed)
COVID Swab obtained 

## 2021-08-28 NOTE — Pre-Procedure Instructions (Signed)
Surgical Instructions ? ? ? Your procedure is scheduled on Monday 09/04/21. ? ? Report to [facility] Main Entrance "A" at 05:30 A.M., then check in with the Admitting office. ? Call this number if you have problems the morning of surgery: ? [phone] ? ? If you have any questions prior to your surgery date call [phone]: Open Monday-Friday 8am-4pm ? ? ? Remember: ? Do not eat after midnight the night before your surgery ? ?You may drink clear liquids until 04:15 the morning of your surgery.   ?Clear liquids allowed are: Water, Non-Citrus Juices (without pulp), Carbonated Beverages, Clear Tea, Black Coffee ONLY (NO MILK, CREAM OR POWDERED CREAMER of any kind), and Gatorade ?  ? Take these medicines the morning of surgery with A SIP OF WATER:  ? amLODipine (NORVASC)  ?  ?DO NOT TAKE metFORMIN (GLUCOPHAGE) the morning of surgery.  ? ?As of today, STOP taking any Aspirin (unless otherwise instructed by your surgeon) Aleve, Naproxen, Ibuprofen, Motrin, Advil, Goody's, BC's, all herbal medications, fish oil, and all vitamins. ? ?         ?Do not wear jewelry or makeup ?Do not wear lotions, powders, perfumes/colognes, or deodorant. ?Do not shave 48 hours prior to surgery.  Men may shave face and neck. ?Do not bring valuables to the hospital. ?Do not wear nail polish, gel polish, artificial nails, or any other type of covering on natural nails (fingers and toes) ?If you have artificial nails or gel coating that need to be removed by a nail salon, please have this removed prior to surgery. Artificial nails or gel coating may interfere with anesthesia's ability to adequately monitor your vital signs. ? ?[facility] is not responsible for any belongings or valuables. .  ? ?Do NOT Smoke (Tobacco/Vaping)  24 hours prior to your procedure ? ?If you use a CPAP at night, you may bring your mask for your overnight stay. ?  ?Contacts, glasses, hearing aids, dentures or partials may not be worn into surgery, please bring cases  for these belongings ?  ?For patients admitted to the hospital, discharge time will be determined by your treatment team. ?  ?Patients discharged the day of surgery will not be allowed to drive home, and someone needs to stay with them for 24 hours. ? ? ?SURGICAL WAITING ROOM VISITATION ?Patients having surgery or a procedure in a hospital may have two support people. ?Children under the age of 60 must have an adult with them who is not the patient. ?They may stay in the waiting area during the procedure and may switch out with other visitors. If the patient needs to stay at the hospital during part of their recovery, the visitor guidelines for inpatient rooms apply. ? ?Please refer to the [facility] website for the visitor guidelines for Inpatients (after your surgery is over and you are in a regular room).  ? ? ? ? ? ?Special instructions:   ? ?Oral Hygiene is also important to reduce your risk of infection.  Remember - BRUSH YOUR TEETH THE MORNING OF SURGERY WITH YOUR REGULAR TOOTHPASTE ? ? ?[facility]- Preparing For Surgery ? ?Before surgery, you can play an important role. Because skin is not sterile, your skin needs to be as free of germs as possible. You can reduce the number of germs on your skin by washing with CHG (chlorahexidine gluconate) Soap before surgery.  CHG is an antiseptic cleaner which kills germs and bonds with the skin to continue killing germs even after  washing.   ? ? ?Please do not use if you have an allergy to CHG or antibacterial soaps. If your skin becomes reddened/irritated stop using the CHG.  ?Do not shave (including legs and underarms) for at least 48 hours prior to first CHG shower. It is OK to shave your face. ? ?Please follow these instructions carefully. ?  ? ? Shower the NIGHT BEFORE SURGERY and the MORNING OF SURGERY with CHG Soap.  ? If you chose to wash your hair, wash your hair first as usual with your normal shampoo. After you shampoo, rinse your hair and body thoroughly  to remove the shampoo.  Then [employer] and genitals (private parts) with your normal soap and rinse thoroughly to remove soap. ? ?After that Use CHG Soap as you would any other liquid soap. You can apply CHG directly to the skin and wash gently with a scrungie or a clean washcloth.  ? ?Apply the CHG Soap to your body ONLY FROM THE NECK DOWN.  Do not use on open wounds or open sores. Avoid contact with your eyes, ears, mouth and genitals (private parts). Wash Face and genitals (private parts)  with your normal soap.  ? ?Wash thoroughly, paying special attention to the area where your surgery will be performed. ? ?Thoroughly rinse your body with warm water from the neck down. ? ?DO NOT shower/wash with your normal soap after using and rinsing off the CHG Soap. ? ?Pat yourself dry with a CLEAN TOWEL. ? ?Wear CLEAN PAJAMAS to bed the night before surgery ? ?Place CLEAN SHEETS on your bed the night before your surgery ? ?DO NOT SLEEP WITH PETS. ? ? ?Day of Surgery: ? ?Take a shower with CHG soap. ?Wear Clean/Comfortable clothing the morning of surgery ?Do not apply any deodorants/lotions.   ?Remember to brush your teeth WITH YOUR REGULAR TOOTHPASTE. ? ? ? ?If you received a COVID test during your pre-op visit, it is requested that you wear a mask when out in public, stay away from anyone that may not be feeling well, and notify your surgeon if you develop symptoms. If you have been in contact with anyone that has tested positive in the last 10 days, please notify your surgeon. ? ?  ?Please read over the following fact sheets that you were given.  ? ?

## 2021-08-28 NOTE — H&P (Signed)
Subjective:  ?  ? Patient ID: [name] is a 31 y.o. female. ?  ?HPI ?  ?Returns for follow up discussion breast reduction. Current G cup. Reports several year history neck and back pain and associated headaches. This has not been relieved by specialty fitted bra, OTC pain medication, regular exercise PT course for over 6 month trial. Denies rashes. Denies numbness hands. ?  ?Wt stable. States done having kids. ?  ?MMG 4.3.23 normal.. Reports mother with breast ca age 67. Per patient, mother's genetic testing negative.  ?  ?Reports quit smoking one month ago. ?  ?PMH significant for HTN, prediabetes on metformin. ?  ?Lives with daughter age 19. Has family in area to assist with post op care. Works as Production assistant, radio at [employer]. ?  ?Review of Systems  ?Musculoskeletal: Positive for back pain and neck pain.  ?  ?Remainder 12 point review negative ?   ?Objective:  ? Physical Exam ?Cardiovascular:  ?   Rate and Rhythm: Normal rate and regular rhythm.  ?   Heart sounds: Normal heart sounds.  ?Pulmonary:  ?   Effort: Pulmonary effort is normal.  ?   Breath sounds: Normal breath sounds.  ?Lymphadenopathy:  ?   Upper Body:  ?   Right upper body: No axillary adenopathy.  ?   Left upper body: No axillary adenopathy.  ?Skin: ?   Comments: Fitzpatrick 6   ?  ?+shoulder grooving ?Breasts: grade 3 ptosis bilateral ?No masses ?SN to nipple R 45 L 46 cm ?BW R 32 L 33 cm ?Nipple to IMF R 20 L 19 cm ?  ?   ?Assessment:  ?   ?Macromastia ?Chronic neck and back pain ?Morbid obesity ?  ?   ?Plan:  ?   ?Needs to be off all nicotine products for 4-6 w prior to surgery.  ?  ?Chronic neck and back pain that has failed conservative measures in setting of macromastia. No other cause of back pain noted. There is a reasonable likelihood that the patient's symptoms are primarily due to macromastia. Breast reduction surgery has reasonable expectation to relieve chronic pain symptoms. ?  ?Reviewed reduction with anchor type scars, OP  surgery, drains, post operative visits and limitations, recovery. Diminished sensation nipple and breast skin, risk of nipple loss, wound healing problems, asymmetry, incidental carcinoma, changes with wt gain/loss, aging, unacceptable cosmetic appearance reviewed. Reviewed given her breast size and SN to nipple distance she is high risk nipple loss. Reviewed free nipple graft as possibility- in this setting NAC would have no sensation not stimulate and color changes, not be able to breast feed. Discussed effect of pregnancy on duration of surgery and effect of surgery on ability to breast feed (even in absence of FNG). Cannot assure cup size. ?  ?Additional risks including bleeding hematoma seroma damage to adjacent structures need for additional procedures infection blood clots in legs or lungs reviewed. ?  ?Drain teaching completed. Rx for 800 mg ibuprofen and Tramadol. ?  ?Anticipate 1662 g reduction from each breast.  ?

## 2021-08-29 ENCOUNTER — Encounter (HOSPITAL_COMMUNITY): Payer: Self-pay

## 2021-08-29 ENCOUNTER — Encounter (HOSPITAL_COMMUNITY)
Admission: RE | Admit: 2021-08-29 | Discharge: 2021-08-29 | Disposition: A | Discharge status: Home / Self Care | Payer: Medicaid Other | Source: Ambulatory Visit | Attending: Plastic Surgery | Admitting: Plastic Surgery

## 2021-08-29 ENCOUNTER — Other Ambulatory Visit: Payer: Self-pay

## 2021-08-29 VITALS — BP 139/94 | HR 94 | Temp 98.2°F | Resp 18 | Ht 70.0 in | Wt 315.5 lb

## 2021-08-29 DIAGNOSIS — Z01818 Encounter for other preprocedural examination: Secondary | ICD-10-CM | POA: Insufficient documentation

## 2021-08-29 HISTORY — DX: Essential (primary) hypertension: I10

## 2021-08-29 HISTORY — DX: Prediabetes: R73.03

## 2021-08-29 LAB — CBC
HCT: 37.1 % (ref 36.0–46.0)
Hemoglobin: 11.5 g/dL — ABNORMAL LOW (ref 12.0–15.0)
MCH: 24.3 pg — ABNORMAL LOW (ref 26.0–34.0)
MCHC: 31 g/dL (ref 30.0–36.0)
MCV: 78.4 fL — ABNORMAL LOW (ref 80.0–100.0)
Platelets: 422 10*3/uL — ABNORMAL HIGH (ref 150–400)
RBC: 4.73 MIL/uL (ref 3.87–5.11)
RDW: 16.6 % — ABNORMAL HIGH (ref 11.5–15.5)
WBC: 14.8 10*3/uL — ABNORMAL HIGH (ref 4.0–10.5)
nRBC: 0 % (ref 0.0–0.2)

## 2021-08-29 LAB — BASIC METABOLIC PANEL
Anion gap: 8 (ref 5–15)
BUN: 10 mg/dL (ref 6–20)
CO2: 23 mmol/L (ref 22–32)
Calcium: 9.2 mg/dL (ref 8.9–10.3)
Chloride: 106 mmol/L (ref 98–111)
Creatinine, Ser: 0.59 mg/dL (ref 0.44–1.00)
GFR, Estimated: >60 mL/min (ref >60)
Glucose, Bld: 96 mg/dL (ref 70–99)
Potassium: 3.5 mmol/L (ref 3.5–5.1)
Sodium: 137 mmol/L (ref 135–145)

## 2021-08-29 NOTE — Progress Notes (Addendum)
PCP - [facility] ?Cardiologist - denies ? ?PPM/ICD - denies ? ?Chest x-ray - n/a ?EKG - 08/29/21 ?Stress Test - denies ?ECHO - denies ?Cardiac Cath - denies ? ?Sleep Study - denies ?CPAP - n/a ? ?Pre-diabetes per patient ? ?Blood Thinner Instructions: n/a ?Aspirin Instructions: n/a ? ?ERAS Protcol - Clear liquids until 0415 am day of surgery ?PRE-SURGERY Ensure or G2- None ordered ? ?COVID TEST- n/a ? ? ?Anesthesia review: yes. Abnormal EKG ? ?Patient denies shortness of breath, fever, cough and chest pain at PAT appointment ? ? ?All instructions explained to the patient, with a verbal understanding of the material. Patient agrees to go over the instructions while at home for a better understanding. Patient also instructed to self quarantine after being tested for COVID-19. The opportunity to ask questions was provided. ? ? ?

## 2021-09-03 NOTE — Anesthesia Preprocedure Evaluation (Addendum)
Anesthesia Evaluation  ?Patient identified by MRN, date of birth, ID band ?Patient awake ? ? ? ?Reviewed: ?Allergy & Precautions, NPO status , Patient's Chart, lab work & pertinent test results ? ?Airway ?Mallampati: II ? ?TM Distance: >3 FB ?Neck ROM: Full ? ? ? Dental ? ?(+)  ?  ?Pulmonary ?neg pulmonary ROS, former smoker,  ?  ?Pulmonary exam normal ?breath sounds clear to auscultation ? ? ? ? ? ? Cardiovascular ?hypertension, Pt. on medications ?negative cardio ROS ?Normal cardiovascular exam ?Rhythm:Regular Rate:Normal ? ?Sinus rhythm with sinus arrhythmia with occasional Premature ventricular complexes ?Nonspecific T wave abnormality ?Abnormal ECG ?When compared with ECG of 19-Feb-2016 09:03, ?PREVIOUS ECG IS PRESENT ?Confirmed by Tonny Bollman [phone]) on 08/29/2021 8:08:54 PM ?  ?Neuro/Psych ?negative neurological ROS ? negative psych ROS  ? GI/Hepatic ?negative GI ROS, Neg liver ROS,   ?Endo/Other  ?Morbid obesityprediabetes ? Renal/GU ?negative Renal ROS  ?negative genitourinary ?  ?Musculoskeletal ?negative musculoskeletal ROS ?(+)  ? Abdominal ?  ?Peds ?negative pediatric ROS ?(+)  Hematology ?negative hematology ROS ?(+)   ?Anesthesia Other Findings ?Conjunctival and scleral erythema of left eye ? Reproductive/Obstetrics ?negative OB ROS ? ?  ? ? ? ? ? ? ? ? ? ? ? ? ? ?  ?  ? ? ? ? ? ? ? ?Anesthesia Physical ?Anesthesia Plan ? ?ASA: 3 ? ?Anesthesia Plan: General  ? ?Post-op Pain Management: Tylenol PO (pre-op)*  ? ?Induction: Intravenous ? ?PONV Risk Score and Plan: 3 and Scopolamine patch - Pre-op, Midazolam, Dexamethasone and Ondansetron ? ?Airway Management Planned: Oral ETT ? ?Additional Equipment: None ? ?Intra-op Plan:  ? ?Post-operative Plan: Extubation in OR ? ?Informed Consent: I have reviewed the patients History and Physical, chart, labs and discussed the procedure including the risks, benefits and alternatives for the proposed anesthesia with the patient or  authorized representative who has indicated his/her understanding and acceptance.  ? ? ? ?Dental advisory given ? ?Plan Discussed with: CRNA, Surgeon and Anesthesiologist ? ?Anesthesia Plan Comments:   ? ? ? ? ? ?Anesthesia Quick Evaluation ? ?

## 2021-09-04 ENCOUNTER — Encounter (HOSPITAL_COMMUNITY): Payer: Self-pay | Admitting: Plastic Surgery

## 2021-09-04 ENCOUNTER — Ambulatory Visit (HOSPITAL_BASED_OUTPATIENT_CLINIC_OR_DEPARTMENT_OTHER): Payer: Medicaid Other | Admitting: Anesthesiology

## 2021-09-04 ENCOUNTER — Ambulatory Visit (HOSPITAL_COMMUNITY)
Admission: RE | Admit: 2021-09-04 | Discharge: 2021-09-04 | Disposition: A | Discharge status: Home / Self Care | Payer: Medicaid Other | Attending: Plastic Surgery | Admitting: Plastic Surgery

## 2021-09-04 ENCOUNTER — Ambulatory Visit (HOSPITAL_COMMUNITY): Payer: Medicaid Other | Admitting: Emergency Medicine

## 2021-09-04 ENCOUNTER — Encounter (HOSPITAL_COMMUNITY)
Admission: RE | Disposition: A | Discharge status: Home / Self Care | Payer: Self-pay | Source: Home / Self Care | Attending: Plastic Surgery

## 2021-09-04 ENCOUNTER — Other Ambulatory Visit: Payer: Self-pay

## 2021-09-04 DIAGNOSIS — Z803 Family history of malignant neoplasm of breast: Secondary | ICD-10-CM | POA: Insufficient documentation

## 2021-09-04 DIAGNOSIS — G8929 Other chronic pain: Secondary | ICD-10-CM | POA: Insufficient documentation

## 2021-09-04 DIAGNOSIS — M542 Cervicalgia: Secondary | ICD-10-CM | POA: Diagnosis not present

## 2021-09-04 DIAGNOSIS — M549 Dorsalgia, unspecified: Secondary | ICD-10-CM | POA: Diagnosis not present

## 2021-09-04 DIAGNOSIS — M25519 Pain in unspecified shoulder: Secondary | ICD-10-CM | POA: Insufficient documentation

## 2021-09-04 DIAGNOSIS — Z7984 Long term (current) use of oral hypoglycemic drugs: Secondary | ICD-10-CM | POA: Diagnosis not present

## 2021-09-04 DIAGNOSIS — Z6841 Body Mass Index (BMI) 40.0 and over, adult: Secondary | ICD-10-CM | POA: Diagnosis not present

## 2021-09-04 DIAGNOSIS — R7303 Prediabetes: Secondary | ICD-10-CM | POA: Insufficient documentation

## 2021-09-04 DIAGNOSIS — N62 Hypertrophy of breast: Secondary | ICD-10-CM

## 2021-09-04 DIAGNOSIS — I10 Essential (primary) hypertension: Secondary | ICD-10-CM | POA: Insufficient documentation

## 2021-09-04 DIAGNOSIS — Z87891 Personal history of nicotine dependence: Secondary | ICD-10-CM | POA: Insufficient documentation

## 2021-09-04 HISTORY — PX: AREOLA/NIPPLE RECONSTRUCTION WITH GRAFT: SHX5586

## 2021-09-04 HISTORY — PX: BREAST REDUCTION SURGERY: SHX8

## 2021-09-04 LAB — POCT PREGNANCY, URINE: Preg Test, Ur: NEGATIVE

## 2021-09-04 LAB — GLUCOSE, CAPILLARY: Glucose-Capillary: 125 mg/dL — ABNORMAL HIGH (ref 70–99)

## 2021-09-04 SURGERY — MAMMOPLASTY, REDUCTION
Anesthesia: General | Site: Breast | Laterality: Bilateral

## 2021-09-04 MED ORDER — LIDOCAINE 2% (20 MG/ML) 5 ML SYRINGE
INTRAMUSCULAR | Status: AC
  Filled 2021-09-04: qty 5

## 2021-09-04 MED ORDER — ROCURONIUM BROMIDE 10 MG/ML (PF) SYRINGE
PREFILLED_SYRINGE | INTRAVENOUS | Status: AC
  Filled 2021-09-04: qty 10

## 2021-09-04 MED ORDER — FENTANYL CITRATE (PF) 250 MCG/5ML IJ SOLN
INTRAMUSCULAR | Status: DC | PRN
  Administered 2021-09-04 (×2): 50 ug via INTRAVENOUS
  Administered 2021-09-04: 100 ug via INTRAVENOUS
  Administered 2021-09-04: 50 ug via INTRAVENOUS
  Administered 2021-09-04 (×4): 25 ug via INTRAVENOUS
  Administered 2021-09-04: 50 ug via INTRAVENOUS
  Administered 2021-09-04: 25 ug via INTRAVENOUS

## 2021-09-04 MED ORDER — ONDANSETRON HCL 4 MG/2ML IJ SOLN
INTRAMUSCULAR | Status: DC | PRN
  Administered 2021-09-04: 4 mg via INTRAVENOUS

## 2021-09-04 MED ORDER — KETAMINE HCL 10 MG/ML IJ SOLN
INTRAMUSCULAR | Status: DC | PRN
  Administered 2021-09-04: 20 mg via INTRAVENOUS
  Administered 2021-09-04 (×2): 10 mg via INTRAVENOUS

## 2021-09-04 MED ORDER — SUCCINYLCHOLINE CHLORIDE 200 MG/10ML IV SOSY
PREFILLED_SYRINGE | INTRAVENOUS | Status: DC | PRN
Start: 2021-09-04 — End: 2021-09-04
  Administered 2021-09-04: 160 mg via INTRAVENOUS

## 2021-09-04 MED ORDER — ROCURONIUM BROMIDE 10 MG/ML (PF) SYRINGE
PREFILLED_SYRINGE | INTRAVENOUS | Status: DC | PRN
  Administered 2021-09-04: 30 mg via INTRAVENOUS
  Administered 2021-09-04: 60 mg via INTRAVENOUS
  Administered 2021-09-04: 30 mg via INTRAVENOUS
  Administered 2021-09-04: 10 mg via INTRAVENOUS
  Administered 2021-09-04: 20 mg via INTRAVENOUS

## 2021-09-04 MED ORDER — ACETAMINOPHEN 500 MG PO TABS
1000.0000 mg | ORAL_TABLET | ORAL | Status: AC
  Administered 2021-09-04: 1000 mg via ORAL
  Filled 2021-09-04: qty 2

## 2021-09-04 MED ORDER — KETAMINE HCL 50 MG/5ML IJ SOSY
PREFILLED_SYRINGE | INTRAMUSCULAR | Status: AC
  Filled 2021-09-04: qty 5

## 2021-09-04 MED ORDER — MIDAZOLAM HCL 2 MG/2ML IJ SOLN
INTRAMUSCULAR | Status: AC
  Filled 2021-09-04: qty 2

## 2021-09-04 MED ORDER — FENTANYL CITRATE (PF) 250 MCG/5ML IJ SOLN
INTRAMUSCULAR | Status: AC
  Filled 2021-09-04: qty 5

## 2021-09-04 MED ORDER — ORAL CARE MOUTH RINSE: 15.0000 mL | Freq: Once | OROMUCOSAL | Status: AC

## 2021-09-04 MED ORDER — PROPOFOL 10 MG/ML IV BOLUS
INTRAVENOUS | Status: AC
  Filled 2021-09-04: qty 20

## 2021-09-04 MED ORDER — CHLORHEXIDINE GLUCONATE CLOTH 2 % EX PADS: 6.0000 | MEDICATED_PAD | Freq: Once | CUTANEOUS | Status: DC

## 2021-09-04 MED ORDER — CELECOXIB 200 MG PO CAPS
200.0000 mg | ORAL_CAPSULE | ORAL | Status: AC
  Administered 2021-09-04: 200 mg via ORAL
  Filled 2021-09-04: qty 1

## 2021-09-04 MED ORDER — DEXAMETHASONE SODIUM PHOSPHATE 10 MG/ML IJ SOLN
INTRAMUSCULAR | Status: AC
  Filled 2021-09-04: qty 1

## 2021-09-04 MED ORDER — ONDANSETRON HCL 4 MG/2ML IJ SOLN
INTRAMUSCULAR | Status: AC
  Filled 2021-09-04: qty 2

## 2021-09-04 MED ORDER — LACTATED RINGERS IV SOLN
INTRAVENOUS | Status: DC
Start: 2021-09-04 — End: 2021-09-04

## 2021-09-04 MED ORDER — LACTATED RINGERS IV SOLN: INTRAVENOUS | Status: DC | PRN

## 2021-09-04 MED ORDER — BUPIVACAINE HCL (PF) 0.5 % IJ SOLN
INTRAMUSCULAR | Status: AC
  Filled 2021-09-04: qty 30

## 2021-09-04 MED ORDER — 0.9 % SODIUM CHLORIDE (POUR BTL) OPTIME
TOPICAL | Status: DC | PRN
  Administered 2021-09-04: 1000 mL

## 2021-09-04 MED ORDER — HYDRALAZINE HCL 20 MG/ML IJ SOLN
INTRAMUSCULAR | Status: AC
  Filled 2021-09-04: qty 1

## 2021-09-04 MED ORDER — DEXAMETHASONE SODIUM PHOSPHATE 10 MG/ML IJ SOLN
INTRAMUSCULAR | Status: DC | PRN
  Administered 2021-09-04: 10 mg via INTRAVENOUS

## 2021-09-04 MED ORDER — SUGAMMADEX SODIUM 500 MG/5ML IV SOLN
INTRAVENOUS | Status: DC | PRN
Start: 2021-09-04 — End: 2021-09-04
  Administered 2021-09-04 (×3): 100 mg via INTRAVENOUS

## 2021-09-04 MED ORDER — PROPOFOL 10 MG/ML IV BOLUS
INTRAVENOUS | Status: DC | PRN
  Administered 2021-09-04: 30 mg via INTRAVENOUS
  Administered 2021-09-04: 200 mg via INTRAVENOUS

## 2021-09-04 MED ORDER — CEFAZOLIN IN SODIUM CHLORIDE 3-0.9 GM/100ML-% IV SOLN
3.0000 g | INTRAVENOUS | Status: AC
  Administered 2021-09-04: 3 g via INTRAVENOUS
  Filled 2021-09-04: qty 100

## 2021-09-04 MED ORDER — SCOPOLAMINE 1 MG/3DAYS TD PT72
1.0000 | MEDICATED_PATCH | TRANSDERMAL | Status: DC
  Administered 2021-09-04: 1.5 mg via TRANSDERMAL
  Filled 2021-09-04: qty 1

## 2021-09-04 MED ORDER — BUPIVACAINE HCL 0.5 % IJ SOLN
INTRAMUSCULAR | Status: AC
  Filled 2021-09-04: qty 1

## 2021-09-04 MED ORDER — CHLORHEXIDINE GLUCONATE 0.12 % MT SOLN
15.0000 mL | Freq: Once | OROMUCOSAL | Status: AC
  Administered 2021-09-04: 15 mL via OROMUCOSAL
  Filled 2021-09-04: qty 15

## 2021-09-04 MED ORDER — LIDOCAINE 2% (20 MG/ML) 5 ML SYRINGE
INTRAMUSCULAR | Status: DC | PRN
  Administered 2021-09-04: 80 mg via INTRAVENOUS

## 2021-09-04 MED ORDER — GABAPENTIN 300 MG PO CAPS
300.0000 mg | ORAL_CAPSULE | ORAL | Status: AC
  Administered 2021-09-04: 300 mg via ORAL
  Filled 2021-09-04: qty 1

## 2021-09-04 MED ORDER — MIDAZOLAM HCL 2 MG/2ML IJ SOLN
INTRAMUSCULAR | Status: DC | PRN
  Administered 2021-09-04 (×2): 1 mg via INTRAVENOUS

## 2021-09-04 MED ORDER — SUCCINYLCHOLINE CHLORIDE 200 MG/10ML IV SOSY
PREFILLED_SYRINGE | INTRAVENOUS | Status: AC
  Filled 2021-09-04: qty 10

## 2021-09-04 MED ORDER — BUPIVACAINE HCL (PF) 0.5 % IJ SOLN
INTRAMUSCULAR | Status: DC | PRN
  Administered 2021-09-04 (×2): 15 mL

## 2021-09-04 MED ORDER — PHENYLEPHRINE HCL-NACL 20-0.9 MG/250ML-% IV SOLN
INTRAVENOUS | Status: AC
  Filled 2021-09-04: qty 250

## 2021-09-04 SURGICAL SUPPLY — 41 items
ADH SKN CLS APL DERMABOND .7 (GAUZE/BANDAGES/DRESSINGS) ×2
APL PRP STRL LF DISP 70% ISPRP (MISCELLANEOUS) ×3
BAG COUNTER SPONGE SURGICOUNT (BAG) ×4 IMPLANT
BAG SPNG CNTER NS LX DISP (BAG) ×2
BINDER BREAST 3XL (GAUZE/BANDAGES/DRESSINGS) ×1 IMPLANT
BLADE SURG 10 STRL SS (BLADE) ×12 IMPLANT
BNDG GAUZE ELAST 4 BULKY (GAUZE/BANDAGES/DRESSINGS) ×6 IMPLANT
CANISTER SUCT 3000ML PPV (MISCELLANEOUS) ×3 IMPLANT
CHLORAPREP W/TINT 26 (MISCELLANEOUS) ×7 IMPLANT
COVER SURGICAL LIGHT HANDLE (MISCELLANEOUS) ×3 IMPLANT
DERMABOND ADVANCED (GAUZE/BANDAGES/DRESSINGS) ×2
DERMABOND ADVANCED .7 DNX12 (GAUZE/BANDAGES/DRESSINGS) ×4 IMPLANT
DRAIN CHANNEL 19F RND (DRAIN) ×2 IMPLANT
DRAPE HALF SHEET 40X57 (DRAPES) ×6 IMPLANT
DRAPE TOP ARMCOVERS (MISCELLANEOUS) ×3 IMPLANT
DRAPE U-SHAPE 76X120 STRL (DRAPES) ×3 IMPLANT
DRSG PAD ABDOMINAL 8X10 ST (GAUZE/BANDAGES/DRESSINGS) ×6 IMPLANT
ELECT COATED BLADE 2.86 ST (ELECTRODE) ×3 IMPLANT
ELECT REM PT RETURN 9FT ADLT (ELECTROSURGICAL) ×2
ELECTRODE REM PT RTRN 9FT ADLT (ELECTROSURGICAL) ×2 IMPLANT
EVACUATOR SILICONE 100CC (DRAIN) ×2 IMPLANT
GLOVE BIO SURGEON STRL SZ 6 (GLOVE) ×6 IMPLANT
GOWN STRL REUS W/ TWL LRG LVL3 (GOWN DISPOSABLE) ×4 IMPLANT
GOWN STRL REUS W/TWL LRG LVL3 (GOWN DISPOSABLE) ×4
MARKER SKIN DUAL TIP RULER LAB (MISCELLANEOUS) ×1 IMPLANT
NDL HYPO 25GX1X1/2 BEV (NEEDLE) ×2 IMPLANT
NEEDLE HYPO 25GX1X1/2 BEV (NEEDLE) ×2 IMPLANT
NS IRRIG 1000ML POUR BTL (IV SOLUTION) ×3 IMPLANT
PACK GENERAL/GYN (CUSTOM PROCEDURE TRAY) ×3 IMPLANT
PIN SAFETY STERILE (MISCELLANEOUS) ×3 IMPLANT
SPONGE T-LAP 18X18 ~~LOC~~+RFID (SPONGE) ×7 IMPLANT
STAPLER VISISTAT 35W (STAPLE) ×3 IMPLANT
SUT ETHILON 2 0 FS 18 (SUTURE) ×3 IMPLANT
SUT MNCRL AB 4-0 PS2 18 (SUTURE) ×7 IMPLANT
SUT VIC AB 3-0 PS1 18 (SUTURE) ×20
SUT VIC AB 3-0 PS1 18XBRD (SUTURE) ×8 IMPLANT
SUT VICRYL 4-0 PS2 18IN ABS (SUTURE) ×6 IMPLANT
SUT VLOC 90 P-14 23 (SUTURE) ×7 IMPLANT
SYR CONTROL 10ML LL (SYRINGE) ×3 IMPLANT
TOWEL GREEN STERILE (TOWEL DISPOSABLE) ×3 IMPLANT
WATER STERILE IRR 1000ML POUR (IV SOLUTION) ×3 IMPLANT

## 2021-09-04 NOTE — Transfer of Care (Signed)
Immediate Anesthesia Transfer of Care Note ? ?Patient: COLEEN CARDIFF ? ?Procedure(s) Performed: MAMMARY REDUCTION  (BREAST) (Bilateral: Breast) ?FREE NIPPLE GRAFTS (Bilateral: Breast) ? ?Patient Location: PACU ? ?Anesthesia Type:General ? ?Level of Consciousness: drowsy and patient cooperative ? ?Airway & Oxygen Therapy: Patient Spontanous Breathing and Patient connected to face mask oxygen ? ?Post-op Assessment: Report given to RN and Post -op Vital signs reviewed and stable ? ?Post vital signs: Reviewed and stable ? ?Last Vitals:  ?Vitals Value Taken Time  ?BP 150/90 09/04/21 1131  ?Temp    ?Pulse 97 09/04/21 1133  ?Resp 29 09/04/21 1133  ?SpO2 97 % 09/04/21 1133  ?Vitals shown include unvalidated device data. ? ?Last Pain:  ?Vitals:  ? 09/04/21 0637  ?TempSrc:   ?PainSc: 0-No pain  ?   ? ?  ? ?Complications: No notable events documented. ?

## 2021-09-04 NOTE — Interval H&P Note (Signed)
History and Physical Interval Note: ? ?09/04/2021 ?6:48 AM ? ?[name]  has presented today for surgery, with the diagnosis of macromastia, chronic neck and back pain.  The various methods of treatment have been discussed with the patient and family. After consideration of risks, benefits and other options for treatment, the patient has consented to  bilateral breast reduction possible free nipple grafts as a surgical intervention.  The patient's history has been reviewed, patient examined, no change in status, stable for surgery.  I have reviewed the patient's chart and labs.  Questions were answered to the patient's satisfaction.   ? ? ?[name] ? ? ?

## 2021-09-04 NOTE — Anesthesia Procedure Notes (Signed)
Procedure Name: Intubation ?Date/Time: 09/04/2021 7:24 AM ?Performed by: Janene Harvey, CRNA ?Pre-anesthesia Checklist: Patient identified, Emergency Drugs available, Suction available and Patient being monitored ?Patient Re-evaluated:Patient Re-evaluated prior to induction ?Oxygen Delivery Method: Circle system utilized ?Preoxygenation: Pre-oxygenation with 100% oxygen ?Induction Type: IV induction and Rapid sequence ?Ventilation: Mask ventilation without difficulty ?Laryngoscope Size: Mac and 4 ?Grade View: Grade I ?Tube type: Oral ?Tube size: 7.0 mm ?Number of attempts: 1 ?Airway Equipment and Method: Stylet and Oral airway ?Placement Confirmation: ETT inserted through vocal cords under direct vision, positive ETCO2 and breath sounds checked- equal and bilateral ?Secured at: 22 cm ?Tube secured with: Tape ?Dental Injury: Teeth and Oropharynx as per pre-operative assessment  ? ? ? ? ?

## 2021-09-04 NOTE — Op Note (Signed)
Operative Note  ? ?DATE OF OPERATION: 5.15.23 ? ?LOCATION: [facility] Main OR-outpatient ? ?SURGICAL DIVISION: Plastic Surgery ? ?PREOPERATIVE DIAGNOSES:  1. Macromastia 2. Chronic neck and back pain 3. Shoulder pain ? ?POSTOPERATIVE DIAGNOSES:  same ? ?PROCEDURE:  Bilateral breast reduction ? ?SURGEON: Glenna Fellows MD MBA ? ?ASSISTANTTalbot Grumbling RNFA ? ?ANESTHESIA:  General.  ? ?EBL: 100 ml ? ?COMPLICATIONS: None immediate.  ? ?INDICATIONS FOR PROCEDURE:  ?The patient, [name], is a 31 y.o. female born on 1990/07/09, is here for treatment chronic neck and back pain, shoulder pain, in setting macromastia that has failed conservative measures.  ?  ?FINDINGS: Right reduction 2792 g Left reduction 2929 g ? ?DESCRIPTION OF PROCEDURE:  ?The patient was marked standing in the preoperative area to mark sternal notch, chest midline, anterior axillary lines, inframammary folds. The location of new nipple areolar complex was marked at level of on inframammary fold on anterior surface breast by palpation. This was marked symmetric over bilateral breasts. With aid of Wise pattern marker, location of new nipple areolar complex and vertical limbs (8 cm) were marked by displacement of breasts along meridian. The patient was taken to the operating room. SCDs were placed and IV antibiotics were given. The patient's operative site was prepped and draped in a sterile fashion. A time out was performed and all information was confirmed to be correct.   ?  ?Over left breast, superior medial pedicle marked and nipple areolar complex incised with 45 mm diameter marker. Pedicle deepithlialized and developed to chest wall. Breast tissue resected over lower pole. Medial and lateral flaps developed. Additional lateral and superior breast tissue excised. Breast tailor tacked closed.  ?  ?I then directed attention to right breast where superior medial pedicle designed. NAC marked with 45 mm diameter marker. The pedicle was deepithelialized.  Pedicle developed to chest wall. Breast tissue resected over lower pole. Medial and lateral flaps developed. Additional lateral and superior pole breast tissue excised. Breast tailor tacked closed and assessed for symmetry. Breast cavities irrigated and hemostasis obtained. Local anesthetic infiltrated throughout each breast. 19 Fr JP placed in each breast and secured with 2-0 nylon. Closure completed bilateral with 3-0 vicryl to approximate dermis along inframammary fold and vertical limb. NAC inset with 4-0 vicryl in dermis. Skin closure completed with 4-0 monocryl subcuticular along vertical limbs and areola. Along inframammary folds, skin closure completed with 3-0 V-lock subcuticular bilateral. Tissue adhesive applied. Dry dressing and breast binder applied. ? ?The patient was allowed to wake from anesthesia, extubated and taken to the recovery room in satisfactory condition.  ? ?SPECIMENS: right and left breast reduction ? ?DRAINS: 19 Fr JP right and left breast ? ?Glenna Fellows, MD MBA ?Plastic & Reconstructive Surgery ? ?Office/ physician access line after hours [phone]   ?

## 2021-09-05 ENCOUNTER — Encounter (HOSPITAL_COMMUNITY): Payer: Self-pay | Admitting: Plastic Surgery

## 2021-09-05 LAB — SURGICAL PATHOLOGY

## 2021-09-05 NOTE — Anesthesia Postprocedure Evaluation (Signed)
Anesthesia Post Note ? ?Patient: [name] ? ?Procedure(s) Performed: MAMMARY REDUCTION  (BREAST) (Bilateral: Breast) ?FREE NIPPLE GRAFTS (Bilateral: Breast) ? ?  ? ?Patient location during evaluation: PACU ?Anesthesia Type: General ?Level of consciousness: awake and alert ?Pain management: pain level controlled ?Vital Signs Assessment: post-procedure vital signs reviewed and stable ?Respiratory status: spontaneous breathing, nonlabored ventilation and respiratory function stable ?Cardiovascular status: blood pressure returned to baseline and stable ?Postop Assessment: no apparent nausea or vomiting ?Anesthetic complications: no ? ? ?No notable events documented. ? ?Last Vitals:  ?Vitals:  ? 09/04/21 1214 09/04/21 1230  ?BP: (!) 138/93 136/84  ?Pulse: 91 84  ?Resp: (!) 29 19  ?Temp:  36.7 ?C  ?SpO2: 96% 98%  ?  ?Last Pain:  ?Vitals:  ? 09/04/21 1230  ?TempSrc:   ?PainSc: 0-No pain  ? ? ?  ?  ?  ?  ?  ?  ? ?[name] ? ? ? ? ?

## 2021-10-11 ENCOUNTER — Other Ambulatory Visit: Payer: Self-pay

## 2021-10-11 ENCOUNTER — Encounter (HOSPITAL_BASED_OUTPATIENT_CLINIC_OR_DEPARTMENT_OTHER): Payer: Self-pay | Admitting: Plastic Surgery

## 2021-10-11 NOTE — H&P (Addendum)
Subjective:     Patient ID: MELESSIA KAUS is a 31 y.o. female.   HPI   5.5 w post op. Course complicated by bilateral NAC necrosis noted at 2 weeks post op and separation skin bilateral breasts. Current wound care moist to dry. Notes significant drainage. Notes difficult for her to look at. Reports right side larger.   Prior G cup. Right reduction 2792 g Left reduction 2929 g   MMG 4.3.23 normal.. Reports mother with breast ca age 25. Per patient, mother's genetic testing negative.    Quit smoking prior to surgery.   PMH significant for HTN, prediabetes on metformin.   Lives with daughter age 51. Has family in area to assist with post op care. 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   Review of Systems      Objective:   Physical Exam Cardiovascular:     Rate and Rhythm: Normal rate and regular rhythm.     Heart sounds: Normal heart sounds.  Pulmonary:     Effort: Pulmonary effort is normal.     Breath sounds: Normal breath sounds.       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    Plan:     Plan OR this week for debridement. Counseled this will make overall volume breasts significantly smaller, will not be able to close skin and goal surgery to make wound care more manageable. Continue moist to dry for now. Rx for tramadol given.

## 2021-10-13 ENCOUNTER — Encounter (HOSPITAL_BASED_OUTPATIENT_CLINIC_OR_DEPARTMENT_OTHER): Payer: Self-pay | Admitting: Plastic Surgery

## 2021-10-13 ENCOUNTER — Ambulatory Visit (HOSPITAL_BASED_OUTPATIENT_CLINIC_OR_DEPARTMENT_OTHER): Payer: Medicaid Other | Admitting: Anesthesiology

## 2021-10-13 ENCOUNTER — Other Ambulatory Visit: Payer: Self-pay

## 2021-10-13 ENCOUNTER — Ambulatory Visit (HOSPITAL_BASED_OUTPATIENT_CLINIC_OR_DEPARTMENT_OTHER)
Admission: RE | Admit: 2021-10-13 | Discharge: 2021-10-13 | Disposition: A | Discharge status: Home / Self Care | Payer: Medicaid Other | Attending: Plastic Surgery | Admitting: Plastic Surgery

## 2021-10-13 ENCOUNTER — Encounter (HOSPITAL_BASED_OUTPATIENT_CLINIC_OR_DEPARTMENT_OTHER)
Admission: RE | Disposition: A | Discharge status: Home / Self Care | Payer: Self-pay | Source: Home / Self Care | Attending: Plastic Surgery

## 2021-10-13 DIAGNOSIS — S21001A Unspecified open wound of right breast, initial encounter: Secondary | ICD-10-CM | POA: Diagnosis not present

## 2021-10-13 DIAGNOSIS — Z9889 Other specified postprocedural states: Secondary | ICD-10-CM | POA: Insufficient documentation

## 2021-10-13 DIAGNOSIS — Z87891 Personal history of nicotine dependence: Secondary | ICD-10-CM | POA: Diagnosis not present

## 2021-10-13 DIAGNOSIS — T8149XA Infection following a procedure, other surgical site, initial encounter: Secondary | ICD-10-CM | POA: Diagnosis not present

## 2021-10-13 DIAGNOSIS — Z7984 Long term (current) use of oral hypoglycemic drugs: Secondary | ICD-10-CM | POA: Insufficient documentation

## 2021-10-13 DIAGNOSIS — X58XXXA Exposure to other specified factors, initial encounter: Secondary | ICD-10-CM | POA: Insufficient documentation

## 2021-10-13 DIAGNOSIS — Z803 Family history of malignant neoplasm of breast: Secondary | ICD-10-CM | POA: Diagnosis not present

## 2021-10-13 DIAGNOSIS — S21002A Unspecified open wound of left breast, initial encounter: Secondary | ICD-10-CM | POA: Insufficient documentation

## 2021-10-13 DIAGNOSIS — I10 Essential (primary) hypertension: Secondary | ICD-10-CM | POA: Insufficient documentation

## 2021-10-13 DIAGNOSIS — R7303 Prediabetes: Secondary | ICD-10-CM | POA: Insufficient documentation

## 2021-10-13 HISTORY — PX: INCISION AND DRAINAGE OF WOUND: SHX1803

## 2021-10-13 LAB — GLUCOSE, CAPILLARY
Glucose-Capillary: 101 mg/dL — ABNORMAL HIGH (ref 70–99)
Glucose-Capillary: 102 mg/dL — ABNORMAL HIGH (ref 70–99)

## 2021-10-13 LAB — POCT PREGNANCY, URINE: Preg Test, Ur: NEGATIVE

## 2021-10-13 SURGERY — IRRIGATION AND DEBRIDEMENT WOUND
Anesthesia: General | Site: Breast | Laterality: Bilateral

## 2021-10-13 MED ORDER — FENTANYL CITRATE (PF) 100 MCG/2ML IJ SOLN
INTRAMUSCULAR | Status: AC
  Filled 2021-10-13: qty 2

## 2021-10-13 MED ORDER — OXYCODONE HCL 5 MG PO TABS
ORAL_TABLET | ORAL | Status: AC
  Filled 2021-10-13: qty 1

## 2021-10-13 MED ORDER — LIDOCAINE 2% (20 MG/ML) 5 ML SYRINGE
INTRAMUSCULAR | Status: AC
  Filled 2021-10-13: qty 5

## 2021-10-13 MED ORDER — TRAMADOL HCL 50 MG PO TABS: 50.0000 mg | ORAL_TABLET | Freq: Four times a day (QID) | ORAL | 0 refills | Status: AC | PRN

## 2021-10-13 MED ORDER — ROCURONIUM BROMIDE 10 MG/ML (PF) SYRINGE
PREFILLED_SYRINGE | INTRAVENOUS | Status: DC | PRN
  Administered 2021-10-13: 60 mg via INTRAVENOUS

## 2021-10-13 MED ORDER — MIDAZOLAM HCL 2 MG/2ML IJ SOLN
INTRAMUSCULAR | Status: AC
  Filled 2021-10-13: qty 2

## 2021-10-13 MED ORDER — LACTATED RINGERS IV SOLN: INTRAVENOUS | Status: DC

## 2021-10-13 MED ORDER — MIDAZOLAM HCL 5 MG/5ML IJ SOLN
INTRAMUSCULAR | Status: DC | PRN
  Administered 2021-10-13: 2 mg via INTRAMUSCULAR

## 2021-10-13 MED ORDER — ACETAMINOPHEN 500 MG PO TABS
ORAL_TABLET | ORAL | Status: AC
  Filled 2021-10-13: qty 2

## 2021-10-13 MED ORDER — 0.9 % SODIUM CHLORIDE (POUR BTL) OPTIME
TOPICAL | Status: DC | PRN
  Administered 2021-10-13: 700 mL

## 2021-10-13 MED ORDER — GABAPENTIN 300 MG PO CAPS
ORAL_CAPSULE | ORAL | Status: AC
Start: 2021-10-13 — End: ?
  Filled 2021-10-13: qty 1

## 2021-10-13 MED ORDER — OXYCODONE HCL 5 MG PO TABS
5.0000 mg | ORAL_TABLET | Freq: Once | ORAL | Status: AC
  Administered 2021-10-13: 5 mg via ORAL

## 2021-10-13 MED ORDER — ONDANSETRON HCL 4 MG/2ML IJ SOLN
INTRAMUSCULAR | Status: AC
  Filled 2021-10-13: qty 2

## 2021-10-13 MED ORDER — DEXAMETHASONE SODIUM PHOSPHATE 10 MG/ML IJ SOLN
INTRAMUSCULAR | Status: AC
  Filled 2021-10-13: qty 1

## 2021-10-13 MED ORDER — CEFAZOLIN SODIUM-DEXTROSE 1-4 GM/50ML-% IV SOLN
INTRAVENOUS | Status: AC
  Filled 2021-10-13: qty 50

## 2021-10-13 MED ORDER — GABAPENTIN 300 MG PO CAPS
300.0000 mg | ORAL_CAPSULE | ORAL | Status: AC
  Administered 2021-10-13: 300 mg via ORAL

## 2021-10-13 MED ORDER — ONDANSETRON HCL 4 MG/2ML IJ SOLN: 4.0000 mg | Freq: Once | INTRAMUSCULAR | Status: DC | PRN

## 2021-10-13 MED ORDER — PROPOFOL 10 MG/ML IV BOLUS
INTRAVENOUS | Status: AC
  Filled 2021-10-13: qty 20

## 2021-10-13 MED ORDER — FENTANYL CITRATE (PF) 100 MCG/2ML IJ SOLN
25.0000 ug | INTRAMUSCULAR | Status: DC | PRN
  Administered 2021-10-13: 50 ug via INTRAVENOUS

## 2021-10-13 MED ORDER — DAKINS (1/4 STRENGTH) 0.125 % EX SOLN
Freq: Once | CUTANEOUS | Status: DC
  Filled 2021-10-13: qty 473

## 2021-10-13 MED ORDER — ACETAMINOPHEN 500 MG PO TABS
1000.0000 mg | ORAL_TABLET | ORAL | Status: AC
  Administered 2021-10-13: 1000 mg via ORAL

## 2021-10-13 MED ORDER — SUGAMMADEX SODIUM 200 MG/2ML IV SOLN
INTRAVENOUS | Status: DC | PRN
  Administered 2021-10-13: 200 mg via INTRAVENOUS

## 2021-10-13 MED ORDER — DAKINS (1/2 STRENGTH) 0.25 % EX SOLN
Freq: Once | CUTANEOUS | Status: DC
  Filled 2021-10-13: qty 473

## 2021-10-13 MED ORDER — PROPOFOL 10 MG/ML IV BOLUS
INTRAVENOUS | Status: DC | PRN
  Administered 2021-10-13: 200 mg via INTRAVENOUS

## 2021-10-13 MED ORDER — MIDAZOLAM HCL 5 MG/5ML IJ SOLN
INTRAMUSCULAR | Status: DC | PRN
  Administered 2021-10-13: 2 mg via INTRAVENOUS

## 2021-10-13 MED ORDER — FENTANYL CITRATE (PF) 100 MCG/2ML IJ SOLN
INTRAMUSCULAR | Status: DC | PRN
  Administered 2021-10-13: 50 ug via INTRAVENOUS
  Administered 2021-10-13: 100 ug via INTRAVENOUS
  Administered 2021-10-13: 50 ug via INTRAVENOUS

## 2021-10-13 MED ORDER — ONDANSETRON HCL 4 MG/2ML IJ SOLN
INTRAMUSCULAR | Status: DC | PRN
  Administered 2021-10-13: 4 mg via INTRAVENOUS

## 2021-10-13 MED ORDER — CEFAZOLIN IN SODIUM CHLORIDE 3-0.9 GM/100ML-% IV SOLN
3.0000 g | INTRAVENOUS | Status: AC
  Administered 2021-10-13: 3 g via INTRAVENOUS

## 2021-10-13 MED ORDER — LIDOCAINE 2% (20 MG/ML) 5 ML SYRINGE
INTRAMUSCULAR | Status: DC | PRN
  Administered 2021-10-13: 100 mg via INTRAVENOUS

## 2021-10-13 MED ORDER — CELECOXIB 200 MG PO CAPS
200.0000 mg | ORAL_CAPSULE | ORAL | Status: AC
  Administered 2021-10-13: 200 mg via ORAL

## 2021-10-13 MED ORDER — CEFAZOLIN SODIUM-DEXTROSE 2-4 GM/100ML-% IV SOLN
INTRAVENOUS | Status: AC
  Filled 2021-10-13: qty 100

## 2021-10-13 MED ORDER — CELECOXIB 200 MG PO CAPS
ORAL_CAPSULE | ORAL | Status: AC
  Filled 2021-10-13: qty 1

## 2021-10-13 MED ORDER — DEXAMETHASONE SODIUM PHOSPHATE 4 MG/ML IJ SOLN
INTRAMUSCULAR | Status: DC | PRN
  Administered 2021-10-13: 4 mg via INTRAVENOUS

## 2021-10-13 SURGICAL SUPPLY — 62 items
ADH SKN CLS APL DERMABOND .7 (GAUZE/BANDAGES/DRESSINGS)
APL PRP STRL LF DISP 70% ISPRP (MISCELLANEOUS)
BAG DECANTER FOR FLEXI CONT (MISCELLANEOUS) ×3 IMPLANT
BINDER BREAST 3XL (GAUZE/BANDAGES/DRESSINGS) ×1 IMPLANT
BLADE HEX COATED 2.75 (ELECTRODE) ×3 IMPLANT
BLADE SURG 10 STRL SS (BLADE) IMPLANT
BLADE SURG 15 STRL LF DISP TIS (BLADE) IMPLANT
BLADE SURG 15 STRL SS (BLADE)
BNDG GAUZE DERMACEA FLUFF (GAUZE/BANDAGES/DRESSINGS)
BNDG GAUZE DERMACEA FLUFF 4 (GAUZE/BANDAGES/DRESSINGS) IMPLANT
BNDG GZE DERMACEA 4 6PLY (GAUZE/BANDAGES/DRESSINGS)
CANISTER SUCT 1200ML W/VALVE (MISCELLANEOUS) ×3 IMPLANT
CHLORAPREP W/TINT 26 (MISCELLANEOUS) IMPLANT
COVER BACK TABLE 60X90IN (DRAPES) ×3 IMPLANT
COVER MAYO STAND STRL (DRAPES) ×3 IMPLANT
DERMABOND ADVANCED (GAUZE/BANDAGES/DRESSINGS)
DERMABOND ADVANCED .7 DNX12 (GAUZE/BANDAGES/DRESSINGS) IMPLANT
DRAIN CHANNEL 15F RND FF W/TCR (WOUND CARE) IMPLANT
DRAPE LAPAROSCOPIC ABDOMINAL (DRAPES) ×3 IMPLANT
DRAPE TOP ARMCOVERS (MISCELLANEOUS) IMPLANT
DRAPE U-SHAPE 76X120 STRL (DRAPES) IMPLANT
DRAPE UTILITY XL STRL (DRAPES) ×3 IMPLANT
DRSG PAD ABDOMINAL 8X10 ST (GAUZE/BANDAGES/DRESSINGS) ×4 IMPLANT
ELECT COATED BLADE 2.86 ST (ELECTRODE) IMPLANT
ELECT REM PT RETURN 9FT ADLT (ELECTROSURGICAL) ×2
ELECTRODE REM PT RTRN 9FT ADLT (ELECTROSURGICAL) ×2 IMPLANT
EVACUATOR SILICONE 100CC (DRAIN) IMPLANT
GAUZE SPONGE 4X4 12PLY STRL (GAUZE/BANDAGES/DRESSINGS) IMPLANT
GAUZE SPONGE 4X4 12PLY STRL LF (GAUZE/BANDAGES/DRESSINGS) IMPLANT
GLOVE BIO SURGEON STRL SZ 6 (GLOVE) ×3 IMPLANT
GLOVE BIOGEL PI IND STRL 6.5 (GLOVE) IMPLANT
GLOVE BIOGEL PI IND STRL 8 (GLOVE) IMPLANT
GLOVE BIOGEL PI INDICATOR 6.5 (GLOVE) ×1
GLOVE BIOGEL PI INDICATOR 8 (GLOVE) ×1
GOWN STRL REUS W/ TWL LRG LVL3 (GOWN DISPOSABLE) ×4 IMPLANT
GOWN STRL REUS W/TWL LRG LVL3 (GOWN DISPOSABLE) ×4
MAT PREVALON FULL STRYKER (MISCELLANEOUS) ×1 IMPLANT
NDL HYPO 25X1 1.5 SAFETY (NEEDLE) IMPLANT
NEEDLE HYPO 25X1 1.5 SAFETY (NEEDLE) IMPLANT
NS IRRIG 1000ML POUR BTL (IV SOLUTION) IMPLANT
PACK BASIN DAY SURGERY FS (CUSTOM PROCEDURE TRAY) ×3 IMPLANT
PENCIL SMOKE EVACUATOR (MISCELLANEOUS) ×3 IMPLANT
PIN SAFETY STERILE (MISCELLANEOUS) IMPLANT
SHEET MEDIUM DRAPE 40X70 STRL (DRAPES) IMPLANT
SLEEVE SCD COMPRESS KNEE MED (STOCKING) IMPLANT
SPIKE FLUID TRANSFER (MISCELLANEOUS) IMPLANT
SPONGE T-LAP 18X18 ~~LOC~~+RFID (SPONGE) ×3 IMPLANT
STAPLER VISISTAT 35W (STAPLE) ×1 IMPLANT
SUT ETHILON 2 0 FS 18 (SUTURE) IMPLANT
SUT MNCRL AB 4-0 PS2 18 (SUTURE) IMPLANT
SUT PDS AB 0 CT 36 (SUTURE) ×3 IMPLANT
SUT PDS AB 2-0 CT2 27 (SUTURE) ×2 IMPLANT
SUT VIC AB 3-0 FS2 27 (SUTURE) IMPLANT
SUT VICRYL 4-0 PS2 18IN ABS (SUTURE) ×2 IMPLANT
SWAB COLLECTION DEVICE MRSA (MISCELLANEOUS) IMPLANT
SWAB CULTURE ESWAB REG 1ML (MISCELLANEOUS) IMPLANT
SYR BULB IRRIG 60ML STRL (SYRINGE) ×3 IMPLANT
SYR CONTROL 10ML LL (SYRINGE) IMPLANT
TOWEL GREEN STERILE FF (TOWEL DISPOSABLE) ×6 IMPLANT
TUBE CONNECTING 20X1/4 (TUBING) ×3 IMPLANT
UNDERPAD 30X36 HEAVY ABSORB (UNDERPADS AND DIAPERS) ×6 IMPLANT
YANKAUER SUCT BULB TIP NO VENT (SUCTIONS) ×3 IMPLANT

## 2021-10-13 NOTE — Op Note (Signed)
Operative Note   DATE OF OPERATION: 6.23.23  LOCATION: [facility]-outpatient  SURGICAL DIVISION: Plastic Surgery  PREOPERATIVE DIAGNOSES:  1. Open wound bilateral breast with complication 2. S/p bilateral breast reduction  POSTOPERATIVE DIAGNOSES:  same  PROCEDURE:  1. Debridement subcutaneous tissue 100 cm2 2. Closure wound bilateral breast 13 cm  SURGEON: Glenna Fellows MD MBA  ASSISTANT: none  ANESTHESIA:  General.   EBL: 50 ml  COMPLICATIONS: None immediate.   INDICATIONS FOR PROCEDURE:  The patient, [name], is a 31 y.o. female born on Apr 12, 1991, is here for treatment bilateral breast wounds following breast reduction with nipple areolae complex necrosis and dehiscence vertical closure.   FINDINGS: Bilateral breast wounds with exposed fat necrotic.   DESCRIPTION OF PROCEDURE:  The patient's operative site was marked with the patient in the preoperative area. The patient was taken to the operating room. SCDs were placed and IV antibiotics were given. The patient's operative site was prepped and draped in a sterile fashion. A time out was performed and all information was confirmed to be correct. I began on right breast. Exposed dried fat excised sharply with scissors. Curettage performed to entirety wounds. Wound irrigated with saline. Vertical skin pillars approximated in midline breast with interrupted 0 and 2-0 PDS suture in superficial fascia and dermis. I then directed attention to left breast. Exposed dried fat excised sharply with scissors. Curettage performed to entirety wounds. Total area of excision subcutaneous tissue 100 cm2. Wound irrigated with saline. Vertical skin pillars approximated in midline breast with interrupted 0 and 2-0 PDS suture in superficial fascia and dermis. Guaze moistened with 0.125% Dakins solution placed over wounds and dry dressing applied.  The patient was allowed to wake from anesthesia, extubated and taken to the recovery  room in satisfactory condition.   SPECIMENS: right and left breast  DRAINS: none  Glenna Fellows, MD [facility] Plastic & Reconstructive Surgery  Office/ physician access line after hours [phone]

## 2021-10-13 NOTE — Anesthesia Procedure Notes (Addendum)
Procedure Name: Intubation Date/Time: 10/13/2021 2:50 PM  Performed by: Burna Cash, CRNAPre-anesthesia Checklist: Patient identified, Emergency Drugs available, Suction available and Patient being monitored Patient Re-evaluated:Patient Re-evaluated prior to induction Oxygen Delivery Method: Circle system utilized Preoxygenation: Pre-oxygenation with 100% oxygen Induction Type: IV induction Ventilation: Mask ventilation without difficulty Laryngoscope Size: Mac and 4 Grade View: Grade I Tube type: Oral Tube size: 7.0 mm Number of attempts: 1 Airway Equipment and Method: Stylet and Oral airway Placement Confirmation: ETT inserted through vocal cords under direct vision, positive ETCO2 and breath sounds checked- equal and bilateral Secured at: 21 cm Tube secured with: Tape Dental Injury: Teeth and Oropharynx as per pre-operative assessment

## 2021-10-16 ENCOUNTER — Encounter (HOSPITAL_BASED_OUTPATIENT_CLINIC_OR_DEPARTMENT_OTHER): Payer: Self-pay | Admitting: Plastic Surgery

## 2021-10-16 LAB — SURGICAL PATHOLOGY

## 2021-10-25 NOTE — H&P (Signed)
Subjective:     Patient ID: [name] is a 31 y.o. female.   HPI   7.5 w post op bilateral breast reduction. Course complicated by bilateral NAC necrosis noted at 2 weeks post op and separation skin bilateral breasts. Nearly 2 week post debridement and partial closure wounds. Left breast closure dehisced within first few days post op. Current wound care Dakins- notes frequent bleeding over right with dressing change.   Prior G cup. Right reduction 2792 g Left reduction 2929 g   MMG 4.3.23 normal.. Reports mother with breast ca age 51. Per patient, mother's genetic testing negative.    Quit smoking prior to surgery.   PMH significant for HTN, prediabetes on metformin.   Lives with daughter age 80. Has family in area to assist with post op care. 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    Review of Systems      Objective:   Physical Exam Cardiovascular:     Rate and Rhythm: Normal rate and regular rhythm.     Heart sounds: Normal heart sounds.  Pulmonary:     Effort: Pulmonary effort is normal.     Breath sounds: Normal breath sounds.       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 Reviewed goal is to make wound management easier as over right will not have closed wound and will still need weeks to have complete epithelization. Over right discontinue Dakins and start Aquaphor or Adaptic. Provided Adaptic today.

## 2021-10-26 ENCOUNTER — Encounter (HOSPITAL_COMMUNITY): Payer: Self-pay | Admitting: Plastic Surgery

## 2021-10-26 ENCOUNTER — Other Ambulatory Visit: Payer: Self-pay

## 2021-10-26 NOTE — Progress Notes (Addendum)
[name] denies chest pain or shortness of breath.  Patient denies having any s/s of Covid in her household.  Patient denies any known exposure to Covid.   Denise. Charles has Pre- Diabetes.  Patient is prescribed Metformin, she stopped taking it 09/03/21; patient reports that it constipates her.  [name] has not notified her PCP that she stopped Metformin. Patient does not have a CBG machine.  [name] reports that last A1C was 6.3.  I encouraged patient to make an appointment with her PCP. Patient is seen at [facility].  I instructed [name] to hold NSAIDS until told she may continue after surgery.

## 2021-10-27 ENCOUNTER — Encounter (HOSPITAL_COMMUNITY)
Admission: RE | Disposition: A | Discharge status: Home / Self Care | Payer: Self-pay | Source: Home / Self Care | Attending: Plastic Surgery

## 2021-10-27 ENCOUNTER — Encounter (HOSPITAL_COMMUNITY): Payer: Self-pay | Admitting: Plastic Surgery

## 2021-10-27 ENCOUNTER — Ambulatory Visit (HOSPITAL_BASED_OUTPATIENT_CLINIC_OR_DEPARTMENT_OTHER): Payer: Medicaid Other

## 2021-10-27 ENCOUNTER — Ambulatory Visit (HOSPITAL_COMMUNITY): Payer: Medicaid Other

## 2021-10-27 ENCOUNTER — Ambulatory Visit (HOSPITAL_COMMUNITY)
Admission: RE | Admit: 2021-10-27 | Discharge: 2021-10-27 | Disposition: A | Discharge status: Home / Self Care | Payer: Medicaid Other | Attending: Plastic Surgery | Admitting: Plastic Surgery

## 2021-10-27 ENCOUNTER — Other Ambulatory Visit: Payer: Self-pay

## 2021-10-27 DIAGNOSIS — S21001A Unspecified open wound of right breast, initial encounter: Secondary | ICD-10-CM | POA: Diagnosis not present

## 2021-10-27 DIAGNOSIS — Z87891 Personal history of nicotine dependence: Secondary | ICD-10-CM

## 2021-10-27 DIAGNOSIS — S21002A Unspecified open wound of left breast, initial encounter: Secondary | ICD-10-CM

## 2021-10-27 DIAGNOSIS — T8131XA Disruption of external operation (surgical) wound, not elsewhere classified, initial encounter: Secondary | ICD-10-CM | POA: Insufficient documentation

## 2021-10-27 DIAGNOSIS — Z803 Family history of malignant neoplasm of breast: Secondary | ICD-10-CM | POA: Diagnosis not present

## 2021-10-27 DIAGNOSIS — Z9889 Other specified postprocedural states: Secondary | ICD-10-CM | POA: Insufficient documentation

## 2021-10-27 DIAGNOSIS — I10 Essential (primary) hypertension: Secondary | ICD-10-CM | POA: Insufficient documentation

## 2021-10-27 DIAGNOSIS — Y834 Other reconstructive surgery as the cause of abnormal reaction of the patient, or of later complication, without mention of misadventure at the time of the procedure: Secondary | ICD-10-CM | POA: Diagnosis not present

## 2021-10-27 DIAGNOSIS — N62 Hypertrophy of breast: Secondary | ICD-10-CM | POA: Diagnosis not present

## 2021-10-27 DIAGNOSIS — Z6841 Body Mass Index (BMI) 40.0 and over, adult: Secondary | ICD-10-CM | POA: Diagnosis not present

## 2021-10-27 DIAGNOSIS — Z7984 Long term (current) use of oral hypoglycemic drugs: Secondary | ICD-10-CM | POA: Diagnosis not present

## 2021-10-27 DIAGNOSIS — R7303 Prediabetes: Secondary | ICD-10-CM | POA: Insufficient documentation

## 2021-10-27 DIAGNOSIS — N641 Fat necrosis of breast: Secondary | ICD-10-CM | POA: Insufficient documentation

## 2021-10-27 HISTORY — PX: INCISION AND DRAINAGE OF WOUND: SHX1803

## 2021-10-27 LAB — BASIC METABOLIC PANEL
Anion gap: 11 (ref 5–15)
BUN: 8 mg/dL (ref 6–20)
CO2: 22 mmol/L (ref 22–32)
Calcium: 9 mg/dL (ref 8.9–10.3)
Chloride: 103 mmol/L (ref 98–111)
Creatinine, Ser: 0.74 mg/dL (ref 0.44–1.00)
GFR, Estimated: >60 mL/min (ref >60)
Glucose, Bld: 101 mg/dL — ABNORMAL HIGH (ref 70–99)
Potassium: 4 mmol/L (ref 3.5–5.1)
Sodium: 136 mmol/L (ref 135–145)

## 2021-10-27 LAB — CBC
HCT: 31.4 % — ABNORMAL LOW (ref 36.0–46.0)
Hemoglobin: 9.5 g/dL — ABNORMAL LOW (ref 12.0–15.0)
MCH: 22 pg — ABNORMAL LOW (ref 26.0–34.0)
MCHC: 30.3 g/dL (ref 30.0–36.0)
MCV: 72.9 fL — ABNORMAL LOW (ref 80.0–100.0)
Platelets: 608 10*3/uL — ABNORMAL HIGH (ref 150–400)
RBC: 4.31 MIL/uL (ref 3.87–5.11)
RDW: 17 % — ABNORMAL HIGH (ref 11.5–15.5)
WBC: 11.5 10*3/uL — ABNORMAL HIGH (ref 4.0–10.5)
nRBC: 0 % (ref 0.0–0.2)

## 2021-10-27 LAB — POCT PREGNANCY, URINE: Preg Test, Ur: NEGATIVE

## 2021-10-27 SURGERY — IRRIGATION AND DEBRIDEMENT WOUND
Anesthesia: General | Site: Breast | Laterality: Left

## 2021-10-27 MED ORDER — DEXAMETHASONE SODIUM PHOSPHATE 10 MG/ML IJ SOLN
INTRAMUSCULAR | Status: AC
  Filled 2021-10-27: qty 1

## 2021-10-27 MED ORDER — OXYCODONE HCL 5 MG PO TABS
5.0000 mg | ORAL_TABLET | Freq: Once | ORAL | Status: AC | PRN
  Administered 2021-10-27: 5 mg via ORAL

## 2021-10-27 MED ORDER — CHLORHEXIDINE GLUCONATE 0.12 % MT SOLN
OROMUCOSAL | Status: AC
  Filled 2021-10-27: qty 15

## 2021-10-27 MED ORDER — CEFAZOLIN IN SODIUM CHLORIDE 3-0.9 GM/100ML-% IV SOLN
3.0000 g | INTRAVENOUS | Status: AC
  Administered 2021-10-27: 3 g via INTRAVENOUS
  Filled 2021-10-27: qty 100

## 2021-10-27 MED ORDER — 0.9 % SODIUM CHLORIDE (POUR BTL) OPTIME
TOPICAL | Status: DC | PRN
  Administered 2021-10-27: 1000 mL

## 2021-10-27 MED ORDER — PROPOFOL 10 MG/ML IV BOLUS
INTRAVENOUS | Status: DC | PRN
  Administered 2021-10-27: 200 mg via INTRAVENOUS

## 2021-10-27 MED ORDER — ACETAMINOPHEN 500 MG PO TABS: 1000.0000 mg | ORAL_TABLET | Freq: Once | ORAL | Status: DC

## 2021-10-27 MED ORDER — CELECOXIB 200 MG PO CAPS: 200.0000 mg | ORAL_CAPSULE | Freq: Once | ORAL | Status: DC

## 2021-10-27 MED ORDER — LIDOCAINE 2% (20 MG/ML) 5 ML SYRINGE
INTRAMUSCULAR | Status: AC
  Filled 2021-10-27: qty 5

## 2021-10-27 MED ORDER — FENTANYL CITRATE (PF) 100 MCG/2ML IJ SOLN
25.0000 ug | INTRAMUSCULAR | Status: DC | PRN
  Administered 2021-10-27: 50 ug via INTRAVENOUS

## 2021-10-27 MED ORDER — BACITRACIN ZINC 500 UNIT/GM EX OINT
TOPICAL_OINTMENT | CUTANEOUS | Status: AC
  Filled 2021-10-27: qty 28.35

## 2021-10-27 MED ORDER — PROPOFOL 10 MG/ML IV BOLUS
INTRAVENOUS | Status: AC
Start: 2021-10-27 — End: ?
  Filled 2021-10-27: qty 20

## 2021-10-27 MED ORDER — ACETAMINOPHEN 500 MG PO TABS
ORAL_TABLET | ORAL | Status: AC
  Filled 2021-10-27: qty 2

## 2021-10-27 MED ORDER — OXYCODONE HCL 5 MG/5ML PO SOLN: 5.0000 mg | Freq: Once | ORAL | Status: AC | PRN

## 2021-10-27 MED ORDER — LACTATED RINGERS IV SOLN: INTRAVENOUS | Status: DC

## 2021-10-27 MED ORDER — ORAL CARE MOUTH RINSE: 15.0000 mL | Freq: Once | OROMUCOSAL | Status: AC

## 2021-10-27 MED ORDER — FENTANYL CITRATE (PF) 100 MCG/2ML IJ SOLN
INTRAMUSCULAR | Status: AC
  Filled 2021-10-27: qty 2

## 2021-10-27 MED ORDER — ONDANSETRON HCL 4 MG/2ML IJ SOLN
INTRAMUSCULAR | Status: DC | PRN
  Administered 2021-10-27: 4 mg via INTRAVENOUS

## 2021-10-27 MED ORDER — FENTANYL CITRATE (PF) 250 MCG/5ML IJ SOLN
INTRAMUSCULAR | Status: AC
  Filled 2021-10-27: qty 5

## 2021-10-27 MED ORDER — ACETAMINOPHEN 500 MG PO TABS
1000.0000 mg | ORAL_TABLET | ORAL | Status: AC
  Administered 2021-10-27: 1000 mg via ORAL

## 2021-10-27 MED ORDER — DEXAMETHASONE SODIUM PHOSPHATE 10 MG/ML IJ SOLN
INTRAMUSCULAR | Status: DC | PRN
  Administered 2021-10-27: 10 mg via INTRAVENOUS

## 2021-10-27 MED ORDER — SILVER NITRATE-POT NITRATE 75-25 % EX MISC
CUTANEOUS | Status: DC | PRN
  Administered 2021-10-27: 3 via TOPICAL

## 2021-10-27 MED ORDER — ROCURONIUM BROMIDE 10 MG/ML (PF) SYRINGE
PREFILLED_SYRINGE | INTRAVENOUS | Status: DC | PRN
  Administered 2021-10-27: 60 mg via INTRAVENOUS

## 2021-10-27 MED ORDER — CELECOXIB 200 MG PO CAPS
200.0000 mg | ORAL_CAPSULE | ORAL | Status: AC
  Administered 2021-10-27: 200 mg via ORAL
  Filled 2021-10-27: qty 1

## 2021-10-27 MED ORDER — CHLORHEXIDINE GLUCONATE 0.12 % MT SOLN
15.0000 mL | Freq: Once | OROMUCOSAL | Status: AC
  Administered 2021-10-27: 15 mL via OROMUCOSAL

## 2021-10-27 MED ORDER — BACITRACIN ZINC 500 UNIT/GM EX OINT
TOPICAL_OINTMENT | CUTANEOUS | Status: DC | PRN
  Administered 2021-10-27: 1 via TOPICAL

## 2021-10-27 MED ORDER — ONDANSETRON HCL 4 MG/2ML IJ SOLN: 4.0000 mg | Freq: Once | INTRAMUSCULAR | Status: DC | PRN

## 2021-10-27 MED ORDER — LIDOCAINE 2% (20 MG/ML) 5 ML SYRINGE
INTRAMUSCULAR | Status: DC | PRN
  Administered 2021-10-27: 60 mg via INTRAVENOUS

## 2021-10-27 MED ORDER — MIDAZOLAM HCL 2 MG/2ML IJ SOLN
INTRAMUSCULAR | Status: AC
Start: 2021-10-27 — End: ?
  Filled 2021-10-27: qty 2

## 2021-10-27 MED ORDER — SODIUM CHLORIDE 0.9 % IR SOLN
Status: DC | PRN
  Administered 2021-10-27: 1000 mL

## 2021-10-27 MED ORDER — MIDAZOLAM HCL 2 MG/2ML IJ SOLN
INTRAMUSCULAR | Status: DC | PRN
  Administered 2021-10-27: 2 mg via INTRAVENOUS

## 2021-10-27 MED ORDER — FENTANYL CITRATE (PF) 250 MCG/5ML IJ SOLN
INTRAMUSCULAR | Status: DC | PRN
  Administered 2021-10-27: 50 ug via INTRAVENOUS
  Administered 2021-10-27: 150 ug via INTRAVENOUS

## 2021-10-27 MED ORDER — ROCURONIUM BROMIDE 10 MG/ML (PF) SYRINGE
PREFILLED_SYRINGE | INTRAVENOUS | Status: AC
  Filled 2021-10-27: qty 10

## 2021-10-27 MED ORDER — ONDANSETRON HCL 4 MG/2ML IJ SOLN
INTRAMUSCULAR | Status: AC
Start: 2021-10-27 — End: ?
  Filled 2021-10-27: qty 2

## 2021-10-27 MED ORDER — OXYCODONE HCL 5 MG PO TABS
ORAL_TABLET | ORAL | Status: AC
  Filled 2021-10-27: qty 1

## 2021-10-27 MED ORDER — SUGAMMADEX SODIUM 200 MG/2ML IV SOLN
INTRAVENOUS | Status: DC | PRN
  Administered 2021-10-27: 400 mg via INTRAVENOUS

## 2021-10-27 SURGICAL SUPPLY — 31 items
BAG COUNTER SPONGE SURGICOUNT (BAG) ×3 IMPLANT
BAG SPNG CNTER NS LX DISP (BAG) ×1
BINDER BREAST 3XL (GAUZE/BANDAGES/DRESSINGS) ×1 IMPLANT
CANISTER SUCT 3000ML PPV (MISCELLANEOUS) ×4 IMPLANT
COVER SURGICAL LIGHT HANDLE (MISCELLANEOUS) ×3 IMPLANT
DRAPE HALF SHEET 40X57 (DRAPES) ×1 IMPLANT
DRAPE IMP U-DRAPE 54X76 (DRAPES) ×3 IMPLANT
DRAPE LAPAROTOMY 100X72 PEDS (DRAPES) ×3 IMPLANT
DRSG ADAPTIC 3X8 NADH LF (GAUZE/BANDAGES/DRESSINGS) ×2 IMPLANT
DRSG PAD ABDOMINAL 8X10 ST (GAUZE/BANDAGES/DRESSINGS) ×3 IMPLANT
ELECT COATED BLADE 2.86 ST (ELECTRODE) ×3 IMPLANT
ELECT REM PT RETURN 9FT ADLT (ELECTROSURGICAL) ×2
ELECTRODE REM PT RTRN 9FT ADLT (ELECTROSURGICAL) ×2 IMPLANT
GAUZE PAD ABD 8X10 STRL (GAUZE/BANDAGES/DRESSINGS) ×4 IMPLANT
GAUZE SPONGE 4X4 12PLY STRL (GAUZE/BANDAGES/DRESSINGS) ×3 IMPLANT
GLOVE BIO SURGEON STRL SZ 6 (GLOVE) ×3 IMPLANT
GLOVE SURG SS PI 6.0 STRL IVOR (GLOVE) ×3 IMPLANT
GOWN STRL REUS W/ TWL LRG LVL3 (GOWN DISPOSABLE) ×4 IMPLANT
GOWN STRL REUS W/TWL LRG LVL3 (GOWN DISPOSABLE) ×4
HANDPIECE INTERPULSE COAX TIP (DISPOSABLE) ×2
KIT BASIN OR (CUSTOM PROCEDURE TRAY) ×3 IMPLANT
KIT TURNOVER KIT B (KITS) ×3 IMPLANT
NS IRRIG 1000ML POUR BTL (IV SOLUTION) ×3 IMPLANT
PACK GENERAL/GYN (CUSTOM PROCEDURE TRAY) ×3 IMPLANT
PAD ARMBOARD 7.5X6 YLW CONV (MISCELLANEOUS) ×6 IMPLANT
SET HNDPC FAN SPRY TIP SCT (DISPOSABLE) IMPLANT
SUT PDS AB 0 CT1 27 (SUTURE) ×2 IMPLANT
SUT PDS AB 2-0 CT2 27 (SUTURE) ×1 IMPLANT
TOWEL GREEN STERILE (TOWEL DISPOSABLE) ×3 IMPLANT
TOWEL GREEN STERILE FF (TOWEL DISPOSABLE) ×3 IMPLANT
UNDERPAD 30X36 HEAVY ABSORB (UNDERPADS AND DIAPERS) ×3 IMPLANT

## 2021-10-27 NOTE — Transfer of Care (Signed)
Immediate Anesthesia Transfer of Care Note  Patient: [name]  Procedure(s) Performed: IRRIGATION AND DEBRIDEMENT WOUND (Left: Breast)  Patient Location: PACU  Anesthesia Type:General  Level of Consciousness: awake and drowsy  Airway & Oxygen Therapy: Patient Spontanous Breathing  Post-op Assessment: Report given to RN and Post -op Vital signs reviewed and stable  Post vital signs: Reviewed and stable  Last Vitals:  Vitals Value Taken Time  BP 137/86 10/27/21 1230  Temp    Pulse 86 10/27/21 1231  Resp 28 10/27/21 1231  SpO2 97 % 10/27/21 1231  Vitals shown include unvalidated device data.  Last Pain:  Vitals:   10/27/21 0928  TempSrc:   PainSc: 0-No pain      Patients Stated Pain Goal: 0 (10/27/21 0928)  Complications: No notable events documented.

## 2021-10-27 NOTE — Interval H&P Note (Signed)
History and Physical Interval Note:  10/27/2021 10:24 AM  [name]  has presented today for surgery, with the diagnosis of s/p reudction mammaplasty, open wound left breast.  The various methods of treatment have been discussed with the patient and family. After consideration of risks, benefits and other options for treatment, the patient has consented to  debridement left breast partial closure wound as a surgical intervention.  The patient's history has been reviewed, patient examined, no change in status, stable for surgery.  I have reviewed the patient's chart and labs.  Questions were answered to the patient's satisfaction.     Irean Hong [name]

## 2021-10-27 NOTE — Op Note (Signed)
Operative Note   DATE OF OPERATION: 7.7.23  LOCATION: [facility] [place] [facility]patient  SURGICAL DIVISION: Plastic Surgery  PREOPERATIVE DIAGNOSES:  1. Open wound bilateral breast 2. S/p bilateral breast reduction  POSTOPERATIVE DIAGNOSES:  same  PROCEDURE:  1. Closure wound left breast 10 cm  SURGEON: Glenna Fellows MD MBA  ASSISTANT: none  ANESTHESIA:  General.   EBL: 25 ml  COMPLICATIONS: None immediate.   INDICATIONS FOR PROCEDURE:  The patient, [name], is a 32 y.o. female born on 1990/10/10, is here for treatment left breast wound following breast reduction with nipple areolae complex necrosis and dehiscence vertical closure.   FINDINGS: Right breast wound granulated superficial. Treated hypergranulation with silver nitrate. Left breast wounds with exposed fat, 90% base granulated. Treated hypergranulation tissue following closure with silver nitrate..   DESCRIPTION OF PROCEDURE:  The patient's operative site was marked with the patient in the preoperative area. The patient was taken to the operating room. SCDs were placed and IV antibiotics were given. The patient's operative site was prepped and draped in a sterile fashion. A time out was performed and all information was confirmed to be correct. Curettage performed to entirety wounds. Pulse lavage wound with saline completed. Vertical soft tissue pillars approximated in midline breast with interrupted 0 and 2-0 PDS suture in three layers. Additional interrupted horizontal mattress suture placed from lateral skin margin to granulation tissue. Total length closure 10 cm. Silver nitrated applied to bilateral breast hypergranulation tissue. Adaptic applied to each breast wound followed by antibiotic ointment and dry dressing. Breast binder applied.  The patient was allowed to wake from anesthesia, extubated and taken to the recovery room in satisfactory condition.   SPECIMENS: none  DRAINS: none  Glenna Fellows, MD  [facility] Plastic & Reconstructive Surgery  Office/ physician access line after hours [phone]

## 2021-10-27 NOTE — Anesthesia Procedure Notes (Signed)
Procedure Name: Intubation Date/Time: 10/27/2021 11:22 AM  Performed by: Dorthea Cove, CRNAPre-anesthesia Checklist: Patient identified, Emergency Drugs available, Suction available and Patient being monitored Patient Re-evaluated:Patient Re-evaluated prior to induction Oxygen Delivery Method: Circle System Utilized Preoxygenation: Pre-oxygenation with 100% oxygen Induction Type: IV induction Ventilation: Mask ventilation without difficulty Laryngoscope Size: Mac and 4 Grade View: Grade I Tube type: Oral Number of attempts: 1 Airway Equipment and Method: Stylet Placement Confirmation: ETT inserted through vocal cords under direct vision, positive ETCO2 and breath sounds checked- equal and bilateral Secured at: 21 cm Tube secured with: Tape Dental Injury: Teeth and Oropharynx as per pre-operative assessment

## 2021-10-27 NOTE — Progress Notes (Signed)
Wasted 50 mcg of Fentanyl in the stericycle with Jackelyn Knife, RN.

## 2021-10-27 NOTE — Anesthesia Postprocedure Evaluation (Signed)
Anesthesia Post Note  Patient: [name]  Procedure(s) Performed: IRRIGATION AND DEBRIDEMENT WOUND (Left: Breast)     Patient location during evaluation: PACU Anesthesia Type: General Level of consciousness: awake and alert Pain management: pain level controlled Vital Signs Assessment: post-procedure vital signs reviewed and stable Respiratory status: spontaneous breathing, nonlabored ventilation and respiratory function stable Cardiovascular status: stable and blood pressure returned to baseline Anesthetic complications: no   No notable events documented.  Last Vitals:  Vitals:   10/27/21 1245 10/27/21 1300  BP: (!) 148/87 130/71  Pulse: 85 78  Resp: 19 15  Temp:    SpO2: 96% 98%    Last Pain:  Vitals:   10/27/21 1245  TempSrc:   PainSc: 5                  Beryle Lathe

## 2021-10-27 NOTE — Anesthesia Preprocedure Evaluation (Addendum)
Anesthesia Evaluation  Patient identified by MRN, date of birth, ID band Patient awake    Reviewed: Allergy & Precautions, NPO status , Patient's Chart, lab work & pertinent test results  History of Anesthesia Complications Negative for: history of anesthetic complications  Airway Mallampati: II  TM Distance: >3 FB Neck ROM: Full    Dental  (+) Dental Advisory Given, Teeth Intact   Pulmonary Current Smoker and Patient abstained from smoking.,    Pulmonary exam normal        Cardiovascular hypertension (noncompliant), Normal cardiovascular exam     Neuro/Psych negative neurological ROS  negative psych ROS   GI/Hepatic Neg liver ROS, GERD  Medicated and Controlled,  Endo/Other  Morbid obesity Pre-DM   Renal/GU negative Renal ROS     Musculoskeletal negative musculoskeletal ROS (+)   Abdominal   Peds  Hematology negative hematology ROS (+)   Anesthesia Other Findings   Reproductive/Obstetrics                            Anesthesia Physical Anesthesia Plan  ASA: 3  Anesthesia Plan: General   Post-op Pain Management: Tylenol PO (pre-op)* and Celebrex PO (pre-op)*   Induction: Intravenous  PONV Risk Score and Plan: 3 and Treatment may vary due to age or medical condition, Ondansetron, Dexamethasone, Midazolam and Scopolamine patch - Pre-op  Airway Management Planned: LMA  Additional Equipment: None  Intra-op Plan:   Post-operative Plan: Extubation in OR  Informed Consent: I have reviewed the patients History and Physical, chart, labs and discussed the procedure including the risks, benefits and alternatives for the proposed anesthesia with the patient or authorized representative who has indicated his/her understanding and acceptance.     Dental advisory given  Plan Discussed with: CRNA and Anesthesiologist  Anesthesia Plan Comments:        Anesthesia Quick  Evaluation

## 2021-10-28 ENCOUNTER — Encounter (HOSPITAL_COMMUNITY): Payer: Self-pay | Admitting: Plastic Surgery

## 2021-11-02 ENCOUNTER — Encounter (HOSPITAL_BASED_OUTPATIENT_CLINIC_OR_DEPARTMENT_OTHER): Payer: Self-pay | Admitting: Plastic Surgery

## 2022-05-03 ENCOUNTER — Ambulatory Visit (INDEPENDENT_AMBULATORY_CARE_PROVIDER_SITE_OTHER): Payer: Medicaid Other | Admitting: Obstetrics and Gynecology

## 2022-05-03 ENCOUNTER — Encounter: Payer: Self-pay | Admitting: Obstetrics and Gynecology

## 2022-05-03 VITALS — BP 130/85 | HR 81 | Ht 69.0 in | Wt 313.2 lb

## 2022-05-03 DIAGNOSIS — Z3046 Encounter for surveillance of implantable subdermal contraceptive: Secondary | ICD-10-CM

## 2022-05-03 LAB — POCT URINE PREGNANCY: Preg Test, Ur: NEGATIVE

## 2022-05-03 MED ORDER — ETONOGESTREL 68 MG ~~LOC~~ IMPL
68.0000 mg | DRUG_IMPLANT | Freq: Once | SUBCUTANEOUS | Status: AC
  Administered 2022-05-03: 68 mg via SUBCUTANEOUS

## 2022-05-03 NOTE — Progress Notes (Signed)
32 yo here for Nexplanon removal and reinsertion. She had her Nexplanon placed in 2015.Patient reports a monthly period lasting 5-6 days. She is sexually active without contraception. She denies pelvic pain or abnormal discharge. She is without any complaints. Patient is current on pap smear  Past Medical History:  Diagnosis Date   Hypertension    Pre-diabetes    Past Surgical History:  Procedure Laterality Date   AREOLA/NIPPLE RECONSTRUCTION WITH GRAFT Bilateral 09/04/2021   Procedure: FREE NIPPLE GRAFTS;  Surgeon: Irene Limbo, MD;  Location: [place];  Service: Plastics;  Laterality: Bilateral;   BREAST REDUCTION SURGERY Bilateral 09/04/2021   Procedure: MAMMARY REDUCTION  (BREAST);  Surgeon: Irene Limbo, MD;  Location: [place];  Service: Plastics;  Laterality: Bilateral;   FRACTURE SURGERY Right    Right Arm   INCISION AND DRAINAGE OF WOUND Left 10/27/2021   Procedure: IRRIGATION AND DEBRIDEMENT WOUND;  Surgeon: Irene Limbo, MD;  Location: [place];  Service: Plastics;  Laterality: Left;   INCISION AND DRAINAGE OF WOUND Bilateral 10/13/2021   Procedure: IRRIGATION AND DEBRIDEMENT WOUND;  Surgeon: Irene Limbo, MD;  Location: [place];  Service: Plastics;  Laterality: Bilateral;   Family History  Problem Relation Age of Onset   Diabetes Mother    Hypertension Mother    Breast cancer Mother    Social History   Tobacco Use   Smoking status: Former    Packs/day: 0.25    Types: Cigarettes    Quit date: 07/27/2021    Years since quitting: 0.7   Smokeless tobacco: Never  Vaping Use   Vaping Use: Some days  Substance Use Topics   Alcohol use: Yes    Comment: socially   Drug use: No   ROS See pertinent in HPI. All other systems reviewed and non contributory Blood pressure 130/85, pulse 81, height 5\' 9"  (1.753 m), weight (!) 313 lb 3.2 oz (142.1 kg), last menstrual period 04/30/2022. GENERAL: Well-developed, well-nourished female in no acute distress.   EXTREMITIES: No cyanosis, clubbing, or edema NEURO: alert and oriented x 3  A/P 32 yo here for Nexplanon removal and reinsertion - Discussed weight loss management. Patient agrees referral to nutritionist - Patient given informed consent, signed copy in the chart, time out was performed. Pregnancy test was negative.  Removal and Reinsertion Patient given informed consent for removal of her Nexplanon, time out was performed.  Signed copy in the chart.  Appropriate time out taken. Nexplanon site identified in patient's left arm.  Area prepped in usual sterile fashon. Two cc of 1% lidocaine was used to anesthetize the area at the distal end of the implant. A small stab incision was made right beside the implant on the distal portion.  The Nexplanon rod was grasped using hemostats and removed without difficulty.  There was less than 3 cc blood loss. There were no complications.   The incision site was was used to insert the new Nexplanon. Nexplanon was removed form packaging.  Device confirmed in needle, then inserted full length of needle and withdrawn per handbook instructions.  Patient insertion site covered with a bandaid and [place].   Minimal blood loss.  Patient tolerated the procedure well.

## 2022-05-03 NOTE — Progress Notes (Signed)
New patient presents to establish care. Referred from PCP for nexplanon removal, reinsertion.  Nexplanon placed in 2015. Reports device is palpable.   Last Pap Smear: Pt reports 12/2020, normal Patient presents for AEX. Denies vaginal/urinary symptoms. Declines blood work, STI screen today.

## 2022-05-23 ENCOUNTER — Ambulatory Visit: Payer: Medicaid Other | Admitting: Skilled Nursing Facility1
